# Patient Record
Sex: Male | Born: 1975 | Race: White | Hispanic: No | Marital: Married | Smoking: Former smoker
Health system: Southern US, Community
[De-identification: ages and names within clinical notes are randomized; demographics above are authoritative.]

## PROBLEM LIST (undated history)

## (undated) DIAGNOSIS — R112 Nausea with vomiting, unspecified: Secondary | ICD-10-CM

## (undated) DIAGNOSIS — F419 Anxiety disorder, unspecified: Secondary | ICD-10-CM

## (undated) DIAGNOSIS — K5190 Ulcerative colitis, unspecified, without complications: Secondary | ICD-10-CM

## (undated) DIAGNOSIS — E789 Disorder of lipoprotein metabolism, unspecified: Secondary | ICD-10-CM

## (undated) DIAGNOSIS — Z9889 Other specified postprocedural states: Secondary | ICD-10-CM

## (undated) HISTORY — PX: ENDOVENOUS ABLATION SAPHENOUS VEIN W/ LASER: SUR449

---

## 2009-03-16 ENCOUNTER — Encounter (INDEPENDENT_AMBULATORY_CARE_PROVIDER_SITE_OTHER): Payer: Self-pay | Admitting: *Deleted

## 2009-03-16 ENCOUNTER — Ambulatory Visit (HOSPITAL_COMMUNITY): Admission: RE | Admit: 2009-03-16 | Discharge: 2009-03-16 | Payer: Self-pay | Admitting: *Deleted

## 2010-06-30 ENCOUNTER — Ambulatory Visit: Payer: Self-pay | Admitting: Gastroenterology

## 2010-06-30 DIAGNOSIS — F329 Major depressive disorder, single episode, unspecified: Secondary | ICD-10-CM

## 2010-06-30 DIAGNOSIS — R1032 Left lower quadrant pain: Secondary | ICD-10-CM | POA: Insufficient documentation

## 2010-06-30 DIAGNOSIS — F3289 Other specified depressive episodes: Secondary | ICD-10-CM | POA: Insufficient documentation

## 2010-06-30 DIAGNOSIS — K5190 Ulcerative colitis, unspecified, without complications: Secondary | ICD-10-CM | POA: Insufficient documentation

## 2010-06-30 DIAGNOSIS — K625 Hemorrhage of anus and rectum: Secondary | ICD-10-CM | POA: Insufficient documentation

## 2010-06-30 DIAGNOSIS — K612 Anorectal abscess: Secondary | ICD-10-CM | POA: Insufficient documentation

## 2010-06-30 LAB — CONVERTED CEMR LAB: Tissue Transglutaminase Ab, IgA: 3.5 units (ref <20)

## 2010-07-05 LAB — CONVERTED CEMR LAB
BUN: 14 mg/dL (ref 6–23)
Basophils Absolute: 0 10*3/uL (ref 0.0–0.1)
Bilirubin, Direct: 0.1 mg/dL (ref 0.0–0.3)
CO2: 28 meq/L (ref 19–32)
Calcium: 9.5 mg/dL (ref 8.4–10.5)
Creatinine, Ser: 0.8 mg/dL (ref 0.4–1.5)
Eosinophils Absolute: 0 10*3/uL (ref 0.0–0.7)
Ferritin: 10.3 ng/mL — ABNORMAL LOW (ref 22.0–322.0)
HCT: 42.9 % (ref 39.0–52.0)
Hemoglobin: 14.1 g/dL (ref 13.0–17.0)
Lymphs Abs: 0.8 10*3/uL (ref 0.7–4.0)
MCHC: 33 g/dL (ref 30.0–36.0)
Monocytes Relative: 5.1 % (ref 3.0–12.0)
Neutro Abs: 11.5 10*3/uL — ABNORMAL HIGH (ref 1.4–7.7)
RDW: 16.7 % — ABNORMAL HIGH (ref 11.5–14.6)
TSH: 0.32 microintl units/mL — ABNORMAL LOW (ref 0.35–5.50)
Total Protein: 6.7 g/dL (ref 6.0–8.3)
Transferrin: 287 mg/dL (ref 212.0–360.0)
Vitamin B-12: 441 pg/mL (ref 211–911)

## 2010-07-20 ENCOUNTER — Ambulatory Visit: Payer: Self-pay | Admitting: Gastroenterology

## 2010-07-20 LAB — CONVERTED CEMR LAB
AST: 22 units/L (ref 0–37)
Albumin: 4 g/dL (ref 3.5–5.2)
Alkaline Phosphatase: 45 units/L (ref 39–117)
Basophils Absolute: 0 10*3/uL (ref 0.0–0.1)
Bilirubin, Direct: 0.1 mg/dL (ref 0.0–0.3)
HCT: 42.5 % (ref 39.0–52.0)
Hemoglobin: 14.2 g/dL (ref 13.0–17.0)
Lymphs Abs: 0.5 10*3/uL — ABNORMAL LOW (ref 0.7–4.0)
MCHC: 33.5 g/dL (ref 30.0–36.0)
MCV: 85.4 fL (ref 78.0–100.0)
Monocytes Absolute: 0.1 10*3/uL (ref 0.1–1.0)
Neutro Abs: 12 10*3/uL — ABNORMAL HIGH (ref 1.4–7.7)
Platelets: 319 10*3/uL (ref 150.0–400.0)
RDW: 17.1 % — ABNORMAL HIGH (ref 11.5–14.6)
Total Bilirubin: 0.8 mg/dL (ref 0.3–1.2)

## 2010-08-02 ENCOUNTER — Ambulatory Visit: Payer: Self-pay | Admitting: Gastroenterology

## 2010-08-03 ENCOUNTER — Ambulatory Visit: Payer: Self-pay | Admitting: Gastroenterology

## 2010-08-03 LAB — CONVERTED CEMR LAB
ALT: 33 units/L (ref 0–53)
AST: 18 units/L (ref 0–37)
Basophils Absolute: 0 10*3/uL (ref 0.0–0.1)
Bilirubin, Direct: 0.1 mg/dL (ref 0.0–0.3)
Eosinophils Relative: 0.1 % (ref 0.0–5.0)
Monocytes Absolute: 0.4 10*3/uL (ref 0.1–1.0)
Monocytes Relative: 3.6 % (ref 3.0–12.0)
Neutrophils Relative %: 85.6 % — ABNORMAL HIGH (ref 43.0–77.0)
Platelets: 281 10*3/uL (ref 150.0–400.0)
RDW: 18.5 % — ABNORMAL HIGH (ref 11.5–14.6)
Total Bilirubin: 0.6 mg/dL (ref 0.3–1.2)
WBC: 10.6 10*3/uL — ABNORMAL HIGH (ref 4.5–10.5)

## 2010-09-12 ENCOUNTER — Ambulatory Visit: Payer: Self-pay | Admitting: Gastroenterology

## 2010-09-12 LAB — CONVERTED CEMR LAB
ALT: 32 units/L (ref 0–53)
Albumin: 4.3 g/dL (ref 3.5–5.2)
Basophils Relative: 0.3 % (ref 0.0–3.0)
Bilirubin, Direct: 0.1 mg/dL (ref 0.0–0.3)
Eosinophils Absolute: 0.2 10*3/uL (ref 0.0–0.7)
Eosinophils Relative: 3.4 % (ref 0.0–5.0)
HCT: 47.2 % (ref 39.0–52.0)
Lymphs Abs: 1.5 10*3/uL (ref 0.7–4.0)
MCHC: 33.8 g/dL (ref 30.0–36.0)
MCV: 88.1 fL (ref 78.0–100.0)
Monocytes Absolute: 0.5 10*3/uL (ref 0.1–1.0)
Neutrophils Relative %: 64.6 % (ref 43.0–77.0)
RBC: 5.36 M/uL (ref 4.22–5.81)
Total Protein: 6.9 g/dL (ref 6.0–8.3)
WBC: 6.3 10*3/uL (ref 4.5–10.5)

## 2010-10-12 ENCOUNTER — Ambulatory Visit: Payer: Self-pay | Admitting: Gastroenterology

## 2010-10-26 ENCOUNTER — Telehealth: Payer: Self-pay | Admitting: Gastroenterology

## 2010-10-26 ENCOUNTER — Encounter (INDEPENDENT_AMBULATORY_CARE_PROVIDER_SITE_OTHER): Payer: Self-pay | Admitting: *Deleted

## 2010-12-05 ENCOUNTER — Encounter: Payer: Self-pay | Admitting: Gastroenterology

## 2011-01-23 NOTE — Progress Notes (Signed)
Summary: Labs Needed  ---- Converted from flag ---- ---- 10/24/2010 8:36 AM, Harlow Mares CMA (AAMA) wrote: Left a message on patients machine to call back.   ---- 10/20/2010 11:08 AM, Harlow Mares CMA (AAMA) wrote: Left a message on patients machine to call back.   ---- 10/12/2010 1:26 PM, Harlow Mares CMA (AAMA) wrote: check to see if pt had his labs  ---- 09/12/2010 10:25 AM, Harlow Mares CMA (AAMA) wrote: needs cbc and lfts ------------------------------   mailed letter to patient Harlow Mares CMA (AAMA)  October 26, 2010 3:24 PM

## 2011-01-23 NOTE — Assessment & Plan Note (Signed)
Summary: 2-WEEK F/U APPT...LSW.   History of Present Illness Visit Type: Follow-up Visit Primary GI MD: Sheryn Bison MD FACP FAGA Primary Provider: Michiel Cowboy, PA-C Requesting Provider: n/a Chief Complaint: Patient here today for 2 week f/u ulcerative colitis flare. He states that he is feeling much better and denies any GI symptoms at this time. History of Present Illness:   Reinhart is asymptomatic on tapering dose of prednisone and 6-MP 75 mg a day. He has had serial CBC and liver profiles which have been normal. He is currently having 2 soft nonbloody bowel movements a day without abdominal pain or constitutional symptoms. He denies recurrent infections, fever, chills, nausea vomiting, or general malaise. His are prior medicines listed and reviewed in his chart including daily Zoloft 50 mg, tandem iron replacement, calcium with vitamin D, and folic acid.   GI Review of Systems      Denies abdominal pain, acid reflux, belching, bloating, chest pain, dysphagia with liquids, dysphagia with solids, heartburn, loss of appetite, nausea, vomiting, vomiting blood, weight loss, and  weight gain.        Denies anal fissure, black tarry stools, change in bowel habit, constipation, diarrhea, diverticulosis, fecal incontinence, heme positive stool, hemorrhoids, irritable bowel syndrome, jaundice, light color stool, liver problems, rectal bleeding, and  rectal pain.    Current Medications (verified): 1)  Zoloft 50 Mg Tabs (Sertraline Hcl) .... One Tablet By Mouth Once Daily 2)  Multivitamins   Tabs (Multiple Vitamin) .... One Tablet By Mouth Once Daily 3)  Tylenol 325 Mg Tabs (Acetaminophen) .... As Needed 4)  Lialda 1.2 Gm  Tbec (Mesalamine) .... 2 Tabs Daily 5)  Purinethol 50 Mg Tabs (Mercaptopurine) .Marland Kitchen.. 1 and 1/2 Tabs Daily 6)  Prednisone 20 Mg Tabs (Prednisone) .Marland Kitchen.. 1 and 1/2 Tab Daily (30 Mg) 7)  Folic Acid 1 Mg Tabs (Folic Acid) .Marland Kitchen.. 1 By Mouth Once Daily 8)  Tandem 162-115.2 Mg Caps  (Ferrous Fum-Iron Polysacch) .Marland Kitchen.. 1 Po Once Daily 9)  Caltrate 600+d 600-400 Mg-Unit Tabs (Calcium Carbonate-Vitamin D) .... Two Times A Day  Allergies (verified): 1)  ! Doxycycline  Past History:  Past medical, surgical, family and social histories (including risk factors) reviewed for relevance to current acute and chronic problems.  Past Medical History: Reviewed history from 06/30/2010 and no changes required. Current Problems:  RECTAL BLEEDING (ICD-569.3) ABDOMINAL PAIN, LEFT LOWER QUADRANT (ICD-789.04) DEPRESSION (ICD-311) ULCERATIVE COLITIS (ICD-556.9)  Past Surgical History: Reviewed history from 06/30/2010 and no changes required. Unremarkable  Family History: Reviewed history from 06/30/2010 and no changes required. 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 -stopped 2 years ago Alcohol Use - yes-occasional Daily Caffeine Use: 3 daily  Illicit Drug Use - no  Review of Systems  The patient denies allergy/sinus, anemia, anxiety-new, arthritis/joint pain, back pain, blood in urine, breast changes/lumps, change in vision, confusion, cough, coughing up blood, depression-new, fainting, fatigue, fever, headaches-new, hearing problems, heart murmur, heart rhythm changes, itching, menstrual pain, muscle pains/cramps, night sweats, nosebleeds, pregnancy symptoms, shortness of breath, skin rash, sleeping problems, sore throat, swelling of feet/legs, swollen lymph glands, thirst - excessive , urination - excessive , urination changes/pain, urine leakage, vision changes, and voice change.    Vital Signs:  Patient profile:   35 year old male Height:      73 inches Weight:  238.13 pounds BMI:     31.53 BSA:     2.32 Pulse rate:   80 / minute Pulse rhythm:   regular BP sitting:   118 / 80   (left arm)  Vitals Entered By: Lamona Curl CMA (AAMA) (August 03, 2010 11:00 AM)  Physical Exam  General:  Well developed, well nourished, no acute distress.healthy appearing.  healthy appearing.   Head:  Normocephalic and atraumatic. Eyes:  PERRLA, no icterus.exam deferred to patient's ophthalmologist.  exam deferred to patient's ophthalmologist.   Lungs:  Clear throughout to auscultation. Heart:  Regular rate and rhythm; no murmurs, rubs,  or bruits. Abdomen:  Soft, nontender and nondistended. No masses, hepatosplenomegaly or hernias noted. Normal bowel sounds. Extremities:  No clubbing, cyanosis, edema or deformities noted. Neurologic:  Alert and  oriented x4;  grossly normal neurologically. Psych:  Alert and cooperative. Normal mood and affect.   Impression & Recommendations:  Problem # 1:  ULCERATIVE COLITIS (ICD-556.9) Assessment Improved Continue 6-MP 75 mg a day and Lialda 2.4 g a day with tapering of prednisone at a level of 5 mg per day every 2 weeks. He will see me back in 6 weeks' time and we will check CBC before office visit. If he has a relapse while tapering his prednisone, he is to call for advisement.  Problem # 2:  RECTAL BLEEDING (ICD-569.3) Assessment: Improved  Problem # 3:  DEPRESSION (ICD-311) Assessment: Improved Continue Zoloft 50 mg a day.  Patient Instructions: 1)  Please pick up your medications at your pharmacy.  2)  Prednisone Taper: take 20 mg once daily for 2 weeks, then take 15 mg (3 tabs) once daily for 2 weeks, then take 10mg  (2 tabs) once daily for 2 weeks, then take 5mg  (1 tab) once daily for 2 weeks, then stop. 3)  Please schedule a follow-up appointment in 6 weeks. 4)  Please come to the lab to have a CBC  prior to your appointment. 5)  The medication list was reviewed and reconciled.  All changed / newly prescribed medications were explained.  A complete medication list was provided to the patient / caregiver.  6)  Copy Sent To:Dr.  Baxter Hire [facility] 7)  Please schedule a follow-up appointment in 6 to 8 weeks. Prescriptions: PREDNISONE 5 MG TABS (PREDNISONE) take as directed  #90 x 1   Entered by:   Francee Piccolo CMA (AAMA)   Authorized by:   Mardella Layman MD [facility]   Signed by:   Mardella Layman MD [facility] on 08/03/2010   Method used:   Electronically to        [facility] [phone]* (retail)       [address]       [place], [place]  [address]       Ph: [phone] or [phone]       Fax: [phone]   RxID:   [id number]

## 2011-01-23 NOTE — Assessment & Plan Note (Signed)
Summary: flare up ulcerative colitis...em   History of Present Illness Visit Type: new patient  Primary GI MD: Sheryn Bison MD FACP FAGA Primary Provider: [facility]  Requesting Provider: na Chief Complaint: LLQ abd pain, bloating, chest pain, loss of appetite, rectal pain, change in bowel habits, diarrhea, and rectal bleeding  History of Present Illness:   35 year old Caucasian male with 4 year history of recurrent cramping, diarrhea, and rectal bleeding with previous diagnosis of inflammatory bowel disease by Dr. Sabino Gasser several years ago with colonoscopy report available dated March 16, 2009 which showed evidence of a perirectal fistula, diffuse colitis with an incomplete colonoscopy, followed by a CT scan showing diffuse colitis from the rectum to the hepatic flexure area the colon without any evidence of perirectal abscess or fistula formation.  Apparently Brian Blackburn has been on rapidly tapering doses of prednisone for 5 times over the last several years. He allegedly will go from 50 mg a day of prednisone to 10 mg a day and over a two-week period as directed allegedly. He took Asacol for 1.5 years without symptomatic improvement. He has not been on previous immunosuppressant therapy or biological therapy.  He currently is having 6-8 bowel movements a day with rectal bleeding and some mild abdominal cramping and mild malaise and fatigue. He's had no fever chills, skin rashes, or joint pains. He has had drainage of a perirectal fistula x2. He denies use or abuse of alcohol, cigarettes, or NSAIDs. He smoked heavily for many years but discontinued 4 years ago about the time his colitis flare. He does have a long history of a mood disorder and is on Zoloft 50 mg a day. Family history noncontributory.  He currently denies rectal pain or rectal drainage. He is currently tapered down to 10 mg of prednisone a day, complains of fatigue and weakness but no systemic complaints. He is  eating a regular diet and denies any specific food intolerances. He's had no problems with arthritis, skin rash, or joint pains. Family history is entirely negative.   GI Review of Systems    Reports abdominal pain, bloating, and  loss of appetite.     Location of  Abdominal pain: LLQ.    Denies acid reflux, belching, chest pain, dysphagia with liquids, dysphagia with solids, heartburn, nausea, vomiting, vomiting blood, weight loss, and  weight gain.      Reports change in bowel habits, diarrhea, rectal bleeding, and  rectal pain.     Denies anal fissure, black tarry stools, constipation, diverticulosis, fecal incontinence, heme positive stool, hemorrhoids, irritable bowel syndrome, jaundice, light color stool, and  liver problems.    Current Medications (verified): 1)  Zoloft 50 Mg Tabs (Sertraline Hcl) .... One Tablet By Mouth Once Daily 2)  Multivitamins   Tabs (Multiple Vitamin) .... One Tablet By Mouth Once Daily 3)  Tylenol 325 Mg Tabs (Acetaminophen) .... As Needed  Allergies (verified): No Known Drug Allergies  Past History:  Past medical, surgical, family and social histories (including risk factors) reviewed for relevance to current acute and chronic problems.  Past Medical History: Current Problems:  RECTAL BLEEDING (ICD-569.3) ABDOMINAL PAIN, LEFT LOWER QUADRANT (ICD-789.04) DEPRESSION (ICD-311) ULCERATIVE COLITIS (ICD-556.9)  Past Surgical History: Unremarkable  Family History: Reviewed history and no changes required. 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  Alcohol  Use - no Daily Caffeine Use: 3 daily  Illicit Drug Use - no Smoking Status:  quit Drug Use:  no  Review of Systems       The patient complains of fatigue.  The patient denies allergy/sinus, anemia, anxiety-new,  arthritis/joint pain, back pain, blood in urine, breast changes/lumps, change in vision, confusion, cough, coughing up blood, depression-new, fainting, fever, headaches-new, hearing problems, heart murmur, heart rhythm changes, itching, muscle pains/cramps, night sweats, nosebleeds, shortness of breath, skin rash, sleeping problems, sore throat, swelling of feet/legs, swollen lymph glands, thirst - excessive, urination - excessive, urination changes/pain, urine leakage, vision changes, and voice change.   General:  Complains of malaise; denies fever, chills, sweats, anorexia, fatigue, weakness, weight loss, and sleep disorder. Eyes:  Denies blurring, diplopia, irritation, discharge, vision loss, scotoma, eye pain, and photophobia. ENT:  Denies earache, ear discharge, tinnitus, decreased hearing, nasal congestion, loss of smell, nosebleeds, sore throat, hoarseness, and difficulty swallowing. CV:  Denies chest pains, angina, palpitations, syncope, dyspnea on exertion, orthopnea, PND, peripheral edema, and claudication. Resp:  Denies dyspnea at rest, dyspnea with exercise, cough, sputum, wheezing, coughing up blood, and pleurisy. GI:  Complains of abdominal pain, gas/bloating, diarrhea, and bloody BM's; denies difficulty swallowing, pain on swallowing, nausea, indigestion/heartburn, vomiting, vomiting blood, jaundice, constipation, change in bowel habits, black BMs, and fecal incontinence. GU:  Denies urinary burning, blood in urine, urinary frequency, urinary hesitancy, nocturnal urination, urinary incontinence, penile discharge, genital sores, decreased libido, and erectile dysfunction. MS:  Denies joint pain / LOM, joint swelling, joint stiffness, joint deformity, low back pain, muscle weakness, muscle cramps, muscle atrophy, leg pain at night, leg pain with exertion, and shoulder pain / LOM hand / wrist pain (CTS). Derm:  Denies rash, itching, dry skin, hives, moles, warts, and unhealing ulcers. Neuro:   Denies weakness, paralysis, abnormal sensation, seizures, syncope, tremors, vertigo, transient blindness, frequent falls, frequent headaches, difficulty walking, headache, sciatica, radiculopathy other:, restless legs, memory loss, and confusion. Psych:  Complains of depression; denies anxiety, memory loss, suicidal ideation, hallucinations, paranoia, phobia, and confusion; history of a" Mood Disorder" without current problems.. Heme:  Denies bruising, bleeding, enlarged lymph nodes, and pagophagia.  Vital Signs:  Patient profile:   35 year old male Height:      73 inches Weight:      229 pounds BMI:     30.32 Pulse rate:   88 / minute Pulse rhythm:   regular BP sitting:   128 / 76  (left arm) Cuff size:   regular  Vitals Entered By: Ok Anis CMA (June 30, 2010 8:24 AM)  Physical Exam  General:  Well developed, well nourished, no acute distress.healthy appearing.   Head:  Normocephalic and atraumatic. Eyes:  PERRLA, no icterus.exam deferred to patient's ophthalmologist.   Mouth:  No deformity or lesions, dentition normal. Neck:  Supple; no masses or thyromegaly. Lungs:  Clear throughout to auscultation. Heart:  Regular rate and rhythm; no murmurs, rubs,  or bruits. Abdomen:  Soft, nontender and nondistended. No masses, hepatosplenomegaly or hernias noted. Normal bowel sounds. Rectal:  inspection rectum is unremarkable. There is no evidence of perirectal fissures or fistulae. Rectal exam shows some tenderness but no masses. There is bloody mucus present in the rectal vault. Prostate:  .normal size prostate.   Msk:  Symmetrical with no gross deformities. Normal posture. Pulses:  Normal pulses noted. Extremities:  No clubbing, cyanosis, edema or deformities noted. Neurologic:  Alert and  oriented x4;  grossly normal neurologically. Cervical  Nodes:  No significant cervical adenopathy. Psych:  Alert and cooperative. Normal mood and affect.   Impression & Recommendations:  Problem  # 1:  ULCERATIVE COLITIS (ICD-556.9) Assessment Deteriorated Probable severe ulcer colitis versus Crohn's colitis. There is no evidence of perirectal fistulae or abscesses at this time. I do not agree with this previous therapy of high-dose prednisone with quick tapering on multiple occasions. I've increased his prednisone currently because of his acute illness back to 40 mg a day, have placed him only low at 2.4 g a day, and we'll start 6-MP 75 mg a day with appropriate monitors a patient. We've begun discussions for possible Remicade infusions. Labs have been ordered including 6-MP genetics and IBD serologies. I'll see him back in 2 weeks' time and decide about future flex sigmoid-colonoscopy exams. He is to follow a low fiber diet as tolerated. Orders: TLB-CBC Platelet - w/Differential (85025-CBCD) TLB-BMP (Basic Metabolic Panel-BMET) (80048-METABOL) TLB-Hepatic/Liver Function Pnl (80076-HEPATIC) TLB-TSH (Thyroid Stimulating Hormone) (84443-TSH) TLB-B12, Serum-Total ONLY (96295-M84) TLB-Ferritin (82728-FER) TLB-Folic Acid (Folate) (82746-FOL) TLB-IBC Pnl (Iron/FE;Transferrin) (83550-IBC) TLB-Sedimentation Rate (ESR) (85652-ESR) TLB-IgA (Immunoglobulin A) (82784-IGA) T-Sprue Panel (Celiac Disease Aby Eval) (83516x3/[address])  Problem # 2:  ABDOMINAL PAIN, LEFT LOWER QUADRANT (ICD-789.04) Assessment: Improved  Problem # 3:  DEPRESSION (ICD-311) Assessment: Improved Continue Zoloft 50 mg a day  Problem # 4:  ABSCESS, RECTUM (ICD-566) Assessment: Improved Not a current problem. The presence of perirectal disease certainly suggest underlying Crohn's disease.  Patient Instructions: 1)  Begin new medication as directed.  See medication list. 2)  Please go to the basement for lab work. 3)  Increase prednisone to 40 mg a day and start Lialba 2.4 g a day with p.r.n. Imodium use. 4)  Please schedule a follow-up appointment in 2 weeks.  5)  The medication list was reviewed and reconciled.   All changed / newly prescribed medications were explained.  A complete medication list was provided to the patient / caregiver. 6)  Copy sent to : [facility] family practice at [facility] 7)  Advised to stick with a low residue diet  avoiding food that can irritate bowel (see handout).  8)  Inflammatory Bowel Disease brochure given.  Prescriptions: FOLIC ACID 1 MG TABS (FOLIC ACID) 1 by mouth once daily  #30 x 6   Entered by:   Ashok Cordia RN   Authorized by:   Mardella Layman MD [facility]   Signed by:   Ashok Cordia RN on 06/30/2010   Method used:   [facility] to        [facility] [phone][facility] (retail)       [address]       [place], [place]  [address]       Ph: [phone] or [phone]       Fax: [phone]   RxID:   [id number] PREDNISONE 20 MG TABS (PREDNISONE) 2 tabs daily  #100 x 6   Entered by:   Ashok Cordia RN   Authorized by:   Mardella Layman MD [facility]   Signed by:   Ashok Cordia RN on 06/30/2010   Method used:   Electronically to        [facility] [phone]* (retail)       [address]       [place], [place]  [address]       Ph: [phone] or [phone]       Fax: [phone]   RxID:   [id number] PURINETHOL 50 MG TABS (MERCAPTOPURINE) 1  and 1/2 tabs daily  #45 x 6   Entered by:   Ashok Cordia RN   Authorized by:   Mardella Layman MD [facility]   Signed by:   Ashok Cordia RN on 06/30/2010   Method used:   Electronically to        [facility] [phone]* (retail)       [address]       [place], [place]  [address]       Ph: [phone] or [phone]       Fax: [phone]   RxID:   [id number] LIALDA 1.2 GM  TBEC (MESALAMINE) 2 tabs daily  #60 x 6   Entered by:   Ashok Cordia RN   Authorized by:   Mardella Layman MD [facility]   Signed by:   Ashok Cordia RN on 06/30/2010   Method used:   Electronically to        [facility] [phone]* (retail)       [address]       [place], [place]  [address]       Ph: [phone] or [phone]       Fax: [phone]    RxID:   [id number]

## 2011-01-23 NOTE — Procedures (Signed)
Summary: Colon and path   Colonoscopy  Procedure date:  03/16/2009  Findings:      Location:  [facility].   NAME:  Brian Blackburn, Brian Blackburn               ACCOUNT NO.:  [id number]      MEDICAL RECORD NO.:  [id number]          PATIENT TYPE:  AMB      LOCATION:  ENDO                         FACILITY:  [facility]      PHYSICIAN:  Georgiana Spinner, M.D.    DATE OF BIRTH:  1975-12-25      DATE OF PROCEDURE:   DATE OF DISCHARGE:                                  OPERATIVE REPORT      PROCEDURE:  Colonoscopy with biopsy.      INDICATIONS:  Crohn colitis.      ANESTHESIA:  Fentanyl 100 mcg, Versed 10 mg.      PROCEDURE:  With the patient mildly sedated in the left lateral   decubitus position, a rectal examination was performed.  There was a   pinpoint opening near the anal orifice on his left buttock that   represented the end of a fistula, but subsequently the rest of the   rectal examination was unremarkable.  Then the Pentax videoscopic   pediatric colonoscope was inserted in the rectum and passed under direct   vision to a very narrowed area in the colon, at which point I elected to   not proceed any further.  The opening was too narrow to safely pass the   scope.  Photographs and biopsies were taken as we withdrew the   colonoscope, taking circumferential views of the remaining colonic   mucosa, all of which was markedly inflamed.  The endoscope was placed in   retroflexion in the rectum to view the anal canal.  The endoscope was   then straightened and withdrawn.  The patient's vital signs, pulse   oximeter remained stable.  The patient tolerated the procedure well   without apparent complications.      FINDINGS:  Significant changes of the colon with a single fistula noted.      IMPRESSION:  Severe colitis, probably Crohn's.      PLAN:  Will get a CT of the abdomen and pelvis to rule out any   infectious process that could be going on in the area of the fistula   mostly because I  think this patient would benefit subsequently with   biologic therapy in the form of Remicade or one of the other newer   agents, and I will have the patient follow up with me in the next week   after we have gotten a CT scan to proceed with that plan.                  ______________________________   Georgiana Spinner, M.D.            GMO/MEDQ  D:  03/16/2009  T:  03/16/2009  Job:  [id number]      cc:   [facility]     SP-Surgical Pathology - STATUS: Final  .  Perform Date: 24Mar10 00:01  Ordered By: Jarvis Newcomer,            Ordered Date: [phone] 10:08  Facility: [facility]                              Department: CPATH  Service Report Text  [facility]   [address] [place], [place] [address]   [phone]    REPORT OF SURGICAL PATHOLOGY    Case #: [id number]   Patient Name: Brian Blackburn   Office Chart Number: N/A    MRN: [id number]   Pathologist: H. Hollice Espy, MD   DOB/Age 23-Oct-1976 (Age: 41) Gender: M   Date Taken: 03/16/2009   Date Received: 03/16/2009    FINAL DIAGNOSIS    ***MICROSCOPIC EXAMINATION AND DIAGNOSIS***    COLON, BIOPSY: CHRONIC ACTIVE COLITIS, NO EVIDENCE OF GRANULOMA   OR DYSPLASIA SEEN. PLEASE SEE COMMENT.    COMMENT   All biopsy fragments show chronic active colitis characterized by   crypt architecture distortion, expansion of the lamina propria by   chronic inflammatory infiltrates and lymphoid aggregates,   cryptitis and crypt abscess. No granuloma is seen. The overall   findings are most consistent with inflammatory bowel disease and   most compatible with ulcerative colitis. No dysplasia or   malignancy is seen. Clinical and endoscopic correlation is   highly recommended. (HCL:gt, 03/17/09)    gdt   Date Reported: 03/17/2009 H. Hollice Espy, MD   *** Electronically Signed Out By [facility] ***    Clinical information   Colitis; ? Crohn' s (tc)     specimen(s) obtained   Colon, biopsy    Gross Description   Received in formalin are tan, soft tissue fragments that are   submitted in toto. Number: 4   Size: 0.2 to 0.3 cm. (GP:jy) 03/16/09    jy/   Additional Information  [id number] RESULT STATUS : F  External IF Update Timestamp : 2009-03-16:10:08:00.000000

## 2011-01-23 NOTE — Assessment & Plan Note (Signed)
Summary: 2-WEEK F/U APPT...LSW.   History of Present Illness Visit Type: Follow-up Visit Primary GI MD: Sheryn Bison MD Faith Rogue Primary Provider: [facility] at [facility]  Requesting Provider: na Chief Complaint: ulcerative colitis flare, symptoms have improved History of Present Illness:   33% improvement with decreased stooling, decrease bleeding, and no abdominal cramping. He has had no side effects of prednisone except for some myalgias. Appetite is good and his weight is stable and he denies upper gastrointestinal, hepatobiliary, or cardiopulmonary complaints. He's had no change in his mental status or psychiatric difficulties. Lab data was unremarkable except for iron deficiency he currently is on tandem and folic acid.   GI Review of Systems    Reports bloating.      Denies abdominal pain, acid reflux, belching, chest pain, dysphagia with liquids, dysphagia with solids, heartburn, loss of appetite, nausea, vomiting, vomiting blood, weight loss, and  weight gain.      Reports diarrhea and  rectal bleeding.     Denies anal fissure, black tarry stools, change in bowel habit, constipation, diverticulosis, fecal incontinence, heme positive stool, hemorrhoids, irritable bowel syndrome, jaundice, light color stool, liver problems, and  rectal pain.    Current Medications (verified): 1)  Zoloft 50 Mg Tabs (Sertraline Hcl) .... One Tablet By Mouth Once Daily 2)  Multivitamins   Tabs (Multiple Vitamin) .... One Tablet By Mouth Once Daily 3)  Tylenol 325 Mg Tabs (Acetaminophen) .... As Needed 4)  Lialda 1.2 Gm  Tbec (Mesalamine) .... 2 Tabs Daily 5)  Purinethol 50 Mg Tabs (Mercaptopurine) .Marland Kitchen.. 1 and 1/2 Tabs Daily 6)  Prednisone 20 Mg Tabs (Prednisone) .... 2 Tabs Daily 7)  Folic Acid 1 Mg Tabs (Folic Acid) .Marland Kitchen.. 1 By Mouth Once Daily 8)  Tandem 162-115.2 Mg Caps (Ferrous Fum-Iron Polysacch) .Marland Kitchen.. 1 Po Once Daily  Allergies (verified): 1)  ! Doxycycline  Past  History:  Past medical, surgical, family and social histories (including risk factors) reviewed for relevance to current acute and chronic problems.  Past Medical History: Reviewed history from 06/30/2010 and no changes required. Current Problems:  RECTAL BLEEDING (ICD-569.3) ABDOMINAL PAIN, LEFT LOWER QUADRANT (ICD-789.04) DEPRESSION (ICD-311) ULCERATIVE COLITIS (ICD-556.9)  Past Surgical History: Reviewed history from 06/30/2010 and no changes required. Unremarkable  Family History: Reviewed history from 06/30/2010 and no changes required. 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  Alcohol Use - no Daily Caffeine Use: 3 daily  Illicit Drug Use - no  Review of Systems       The patient complains of allergy/sinus, anemia, anxiety-new, arthritis/joint pain, back pain, fatigue, and itching.  The patient denies blood in urine, breast changes/lumps, change in vision, confusion, cough, coughing up blood, depression-new, fainting, fever, headaches-new, hearing problems, heart murmur, heart rhythm changes, menstrual pain, muscle pains/cramps, night sweats, nosebleeds, pregnancy symptoms, shortness of breath, skin rash, sleeping problems, sore throat, swelling of feet/legs, swollen lymph glands, thirst - excessive, urination - excessive, urination changes/pain, urine leakage, vision changes, and voice change.    Vital Signs:  Patient profile:   35 year old male Height:      73 inches Weight:      231 pounds BMI:     30.59 Pulse rate:   68 / minute Pulse rhythm:   regular BP sitting:   110 / 70  (  left arm) Cuff size:   regular  Vitals Entered By: June McMurray CMA Duncan Dull) (July 20, 2010 10:45 AM)  Physical Exam  General:  Well developed, well nourished, no acute distress.Some moon facies noted  but no stigmata of chronic liver disease or icterus Head:  Normocephalic and atraumatic. Eyes:  PERRLA, no icterus.exam deferred to patient's ophthalmologist.  exam deferred to patient's ophthalmologist.   Lungs:  Clear throughout to auscultation. Heart:  Regular rate and rhythm; no murmurs, rubs,  or bruits. Abdomen:  Soft, nontender and nondistended. No masses, hepatosplenomegaly or hernias noted. Normal bowel sounds. Extremities:  No clubbing, cyanosis, edema or deformities noted. Neurologic:  Alert and  oriented x4;  grossly normal neurologically. Psych:  Alert and cooperative. Normal mood and affect.   Impression & Recommendations:  Problem # 1:  ULCERATIVE COLITIS (ICD-556.9) Assessment Improved Continue her medications but will decrease prednisone to 30 mg a day for 2 weeks with office followup at that time. We have started 6-MP 75 mg a day, now repeat his CBC and liver profile today and in 2 weeks. I've advised him to take Caltrate 600 with vitamin D 2 a day. He will probably need flexible sigmoidoscopy to assess mucosal healing in the next few months.  Problem # 2:  ABDOMINAL PAIN, LEFT LOWER QUADRANT (ICD-789.04) Assessment: Improved  Patient Instructions: 1)  Please go to the basement for lab work. 2)  Decrease Prednisone to 30 mg (1 and 1/2 tabs) daily. 3)  Begin Caltrate 600 mg + Vit D two times a day . 4)  Please schedule a follow-up appointment in 2 weeks. Please stop by the lab the day before your appt for lab work. 5)  The medication list was reviewed and reconciled.  All changed / newly prescribed medications were explained.  A complete medication list was provided to the patient / caregiver. 6)  Copy sent to : [facility] at [facility].  Appended Document: 2-WEEK F/U APPT...LSW.    Clinical Lists Changes  Orders: Added new Test order of TLB-Hepatic/Liver Function Pnl (80076-HEPATIC) - Signed Added new Test order of TLB-CBC Platelet - w/Differential  (85025-CBCD) - Signed      Appended Document: 2-WEEK F/U APPT...LSW.    Clinical Lists Changes  Medications: Changed medication from PREDNISONE 20 MG TABS (PREDNISONE) 2 tabs daily to PREDNISONE 20 MG TABS (PREDNISONE) 1 and 1/2 tab daily (30 mg) Added new medication of CALTRATE 600+D 600-400 MG-UNIT TABS (CALCIUM CARBONATE-VITAMIN D) two times a day

## 2011-01-23 NOTE — Assessment & Plan Note (Signed)
Summary: 6-WEEK F/U APPT...LSW.   History of Present Illness Visit Type: Follow-up Visit Primary GI MD: Sheryn Bison MD FACP FAGA Primary Provider: Michiel Cowboy, PA-C Requesting Provider: n/a Chief Complaint: F/u for ulcerative colitis. Pt denies any GI complaints  History of Present Illness:   He has tapered off his prednisone, has not used prednisone in 10 days. He continues on his other medications including 6-MP 75 mg a day and Lialba 2.4 g a day. He is having normal bowel movements without rectal bleeding or abdominal pain or any systemic complaints.   GI Review of Systems      Denies abdominal pain, acid reflux, belching, bloating, chest pain, dysphagia with liquids, dysphagia with solids, heartburn, loss of appetite, nausea, vomiting, vomiting blood, weight loss, and  weight gain.        Denies anal fissure, black tarry stools, change in bowel habit, constipation, diarrhea, diverticulosis, fecal incontinence, heme positive stool, hemorrhoids, irritable bowel syndrome, jaundice, light color stool, liver problems, rectal bleeding, and  rectal pain.    Current Medications (verified): 1)  Zoloft 50 Mg Tabs (Sertraline Hcl) .... One Tablet By Mouth Once Daily 2)  Multivitamins   Tabs (Multiple Vitamin) .... One Tablet By Mouth Once Daily 3)  Tylenol 325 Mg Tabs (Acetaminophen) .... As Needed 4)  Lialda 1.2 Gm  Tbec (Mesalamine) .... 2 Tabs Daily 5)  Purinethol 50 Mg Tabs (Mercaptopurine) .Marland Kitchen.. 1 and 1/2 Tabs Daily 6)  Folic Acid 1 Mg Tabs (Folic Acid) .Marland Kitchen.. 1 By Mouth Once Daily 7)  Tandem 162-115.2 Mg Caps (Ferrous Fum-Iron Polysacch) .Marland Kitchen.. 1 Po Once Daily 8)  Caltrate 600+d 600-400 Mg-Unit Tabs (Calcium Carbonate-Vitamin D) .... Two Times A Day  Allergies (verified): 1)  ! Doxycycline  Past History:  Past medical, surgical, family and social histories (including risk factors) reviewed for relevance to current acute and chronic problems.  Past Medical History: Reviewed  history from 06/30/2010 and no changes required. Current Problems:  RECTAL BLEEDING (ICD-569.3) ABDOMINAL PAIN, LEFT LOWER QUADRANT (ICD-789.04) DEPRESSION (ICD-311) ULCERATIVE COLITIS (ICD-556.9)  Past Surgical History: Reviewed history from 06/30/2010 and no changes required. Unremarkable  Family History: Reviewed history from 06/30/2010 and no changes required. 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 -stopped 2 years ago Alcohol Use - yes-occasional Daily Caffeine Use: 3 daily  Illicit Drug Use - no  Review of Systems  The patient denies allergy/sinus, anemia, anxiety-new, arthritis/joint pain, back pain, blood in urine, breast changes/lumps, change in vision, confusion, cough, coughing up blood, depression-new, fainting, fatigue, fever, headaches-new, hearing problems, heart murmur, heart rhythm changes, itching, menstrual pain, muscle pains/cramps, night sweats, nosebleeds, pregnancy symptoms, shortness of breath, skin rash, sleeping problems, sore throat, swelling of feet/legs, swollen lymph glands, thirst - excessive , urination - excessive , urination changes/pain, urine leakage, vision changes, and voice change.    Vital Signs:  Patient profile:   35 year old male Height:      73 inches Weight:      235 pounds BMI:     31.12 BSA:     2.31 Pulse rate:   72 / minute Pulse rhythm:   regular BP sitting:   120 / 76  (left arm) Cuff size:   regular  Vitals Entered By: Ok Anis CMA (September 12, 2010 9:54 AM)  Physical Exam  General:  Well developed, well nourished, no acute distress.healthy appearing.  healthy appearing.   Head:  Normocephalic and atraumatic. Eyes:  PERRLA, no icterus.exam deferred to patient's ophthalmologist.  exam deferred to patient's ophthalmologist.     Abdomen:  Soft, nontender and nondistended. No masses, hepatosplenomegaly or hernias noted. Normal bowel sounds. Psych:  Alert and cooperative. Normal mood and affect.   Impression & Recommendations:  Problem # 1:  ULCERATIVE COLITIS (ICD-556.9) Assessment Improved Continue 6-MP 75 mg a day and Lialba 2.4 g a day with CBC and liver profile today, in one month, and in 3 months for his clinic visit. He is to call should he have a relapse of his problems. I see no need for colonoscopy at this time.  Problem # 2:  RECTAL BLEEDING (ICD-569.3) Assessment: Improved  Problem # 3:  DEPRESSION (ICD-311) Assessment: Improved Continued Zoloft 50 mg a day as tolerated.  Other Orders: TLB-CBC Platelet - w/Differential (85025-CBCD) TLB-Hepatic/Liver Function Pnl (80076-HEPATIC)  Patient Instructions: 1)  Copy sent to : Michiel Cowboy, PA-C 2)  Please continue current medications.  3)  Please go to the basement today for your labs.  4)  Inflammatory Bowel Disease brochure given.  5)  You will need follow up labs in one month, two months and three months I will call you to remind you. 6)  Please schedule a follow-up appointment in 3 months. 7)  The medication list was reviewed and reconciled.  All changed / newly prescribed medications were explained.  A complete medication list was provided to the patient / caregiver.

## 2011-01-23 NOTE — Letter (Signed)
Summary: Appointment Reminder  [facility]  [address] [place], [place] [address]   Phone: [phone]  Fax: [phone]        October 26, 2010 MRN: [id number]    Brian Blackburn [address] [place], [place]  [address]    Dear Brian Blackburn,   We have been unable to reach you by phone to schedule your follow up   labs that were recommended for you by Dr. Jarold Motto. It is very   important that we reach you to schedule this appointment. We hope that   you allow [place] to participate in your health care needs. Please contact us   at [phone] at your earliest convenience to schedule your   appointment.     Sincerely,    Harlow Mares CMA (AAMA)

## 2011-01-25 NOTE — Letter (Signed)
Summary: Office Visit Letter  [facility]  [address] [place], [place] [address]   Phone: [phone]  Fax: [phone]      December 05, 2010 MRN: [id number]   JAVIN NONG [address] [place], [place]  [address]   Dear Mr. [place],   According to our records, it is time for you to schedule a follow-up office visit with [place].   At your convenience, please call [phone] (option #2)to schedule an office visit. If you have any questions, concerns, or feel that this letter is in error, we would appreciate your call.   Sincerely,    Vania Rea. Jarold Motto, M.D.  [facility] [phone]

## 2011-04-05 LAB — BASIC METABOLIC PANEL
Calcium: 8.7 mg/dL (ref 8.4–10.5)
GFR calc Af Amer: >60 mL/min (ref >60)
GFR calc non Af Amer: >60 mL/min (ref >60)
Potassium: 3.3 mEq/L — ABNORMAL LOW (ref 3.5–5.1)
Sodium: 140 mEq/L (ref 135–145)

## 2011-05-08 NOTE — Op Note (Signed)
NAME:  Brian Blackburn, Brian Blackburn NO.:  [id number]   MEDICAL RECORD NO.:  [id number]          PATIENT TYPE:  AMB   LOCATION:  ENDO                         FACILITY:  [facility]   PHYSICIAN:  Georgiana Spinner, M.D.    DATE OF BIRTH:  October 02, 1976   DATE OF PROCEDURE:  DATE OF DISCHARGE:                               OPERATIVE REPORT   PROCEDURE:  Colonoscopy with biopsy.   INDICATIONS:  Crohn colitis.   ANESTHESIA:  Fentanyl 100 mcg, Versed 10 mg.   PROCEDURE:  With the patient mildly sedated in the left lateral  decubitus position, a rectal examination was performed.  There was a  pinpoint opening near the anal orifice on his left buttock that  represented the end of a fistula, but subsequently the rest of the  rectal examination was unremarkable.  Then the Pentax videoscopic  pediatric colonoscope was inserted in the rectum and passed under direct  vision to a very narrowed area in the colon, at which point I elected to  not proceed any further.  The opening was too narrow to safely pass the  scope.  Photographs and biopsies were taken as we withdrew the  colonoscope, taking circumferential views of the remaining colonic  mucosa, all of which was markedly inflamed.  The endoscope was placed in  retroflexion in the rectum to view the anal canal.  The endoscope was  then straightened and withdrawn.  The patient's vital signs, pulse  oximeter remained stable.  The patient tolerated the procedure well  without apparent complications.   FINDINGS:  Significant changes of the colon with a single fistula noted.   IMPRESSION:  Severe colitis, probably Crohn's.   PLAN:  Will get a CT of the abdomen and pelvis to rule out any  infectious process that could be going on in the area of the fistula  mostly because I think this patient would benefit subsequently with  biologic therapy in the form of Remicade or one of the other newer  agents, and I will have the patient follow up with me in  the next week  after we have gotten a CT scan to proceed with that plan.           ______________________________  Georgiana Spinner, M.D.     GMO/MEDQ  D:  03/16/2009  T:  03/16/2009  Job:  [id number]   cc:   [facility]

## 2012-09-17 ENCOUNTER — Encounter: Payer: Self-pay | Admitting: Gastroenterology

## 2018-06-30 ENCOUNTER — Other Ambulatory Visit: Payer: Self-pay

## 2018-06-30 ENCOUNTER — Encounter (HOSPITAL_COMMUNITY): Payer: Self-pay

## 2018-06-30 ENCOUNTER — Emergency Department (HOSPITAL_COMMUNITY): Payer: Commercial Managed Care - PPO

## 2018-06-30 ENCOUNTER — Emergency Department (HOSPITAL_COMMUNITY)
Admission: EM | Admit: 2018-06-30 | Discharge: 2018-06-30 | Disposition: A | Discharge status: Home / Self Care | Payer: Commercial Managed Care - PPO | Attending: Emergency Medicine | Admitting: Emergency Medicine

## 2018-06-30 DIAGNOSIS — R079 Chest pain, unspecified: Secondary | ICD-10-CM

## 2018-06-30 DIAGNOSIS — F17220 Nicotine dependence, chewing tobacco, uncomplicated: Secondary | ICD-10-CM | POA: Insufficient documentation

## 2018-06-30 DIAGNOSIS — R002 Palpitations: Secondary | ICD-10-CM | POA: Diagnosis not present

## 2018-06-30 HISTORY — DX: Anxiety disorder, unspecified: F41.9

## 2018-06-30 HISTORY — DX: Ulcerative colitis, unspecified, without complications: K51.90

## 2018-06-30 LAB — CBC
HCT: 46.8 % (ref 39.0–52.0)
Hemoglobin: 16.3 g/dL (ref 13.0–17.0)
MCH: 31.6 pg (ref 26.0–34.0)
MCHC: 34.8 g/dL (ref 30.0–36.0)
MCV: 90.7 fL (ref 78.0–100.0)
Platelets: 198 10*3/uL (ref 150–400)
RBC: 5.16 MIL/uL (ref 4.22–5.81)
RDW: 13.5 % (ref 11.5–15.5)
WBC: 7.3 10*3/uL (ref 4.0–10.5)

## 2018-06-30 LAB — BASIC METABOLIC PANEL
Anion gap: 17 — ABNORMAL HIGH (ref 5–15)
BUN: 16 mg/dL (ref 6–20)
CO2: 22 mmol/L (ref 22–32)
Calcium: 10 mg/dL (ref 8.9–10.3)
Chloride: 100 mmol/L (ref 98–111)
Creatinine, Ser: 0.9 mg/dL (ref 0.61–1.24)
GFR calc Af Amer: >60 mL/min (ref >60)
GFR calc non Af Amer: >60 mL/min (ref >60)
Glucose, Bld: 89 mg/dL (ref 70–99)
Potassium: 3.7 mmol/L (ref 3.5–5.1)
Sodium: 139 mmol/L (ref 135–145)

## 2018-06-30 LAB — I-STAT TROPONIN, ED
TROPONIN I, POC: 0.01 ng/mL (ref 0.00–0.08)
Troponin i, poc: 0 ng/mL (ref 0.00–0.08)

## 2018-06-30 LAB — D-DIMER, QUANTITATIVE (NOT AT ARMC): D-Dimer, Quant: 0.74 ug/mL-FEU — ABNORMAL HIGH (ref 0.00–0.50)

## 2018-06-30 MED ORDER — IOPAMIDOL (ISOVUE-370) INJECTION 76%
100.0000 mL | Freq: Once | INTRAVENOUS | Status: AC | PRN
  Administered 2018-06-30: 100 mL via INTRAVENOUS

## 2018-06-30 MED ORDER — IOPAMIDOL (ISOVUE-370) INJECTION 76%
INTRAVENOUS | Status: AC
  Filled 2018-06-30: qty 100

## 2018-06-30 NOTE — ED Notes (Signed)
Patient transported to CT 

## 2018-06-30 NOTE — ED Provider Notes (Signed)
[facility]-EMERGENCY DEPT Provider Note   CSN: [id number] Arrival date & time: 06/30/18  4098     History   Chief Complaint Chief Complaint  Patient presents with  . Chest Pain  . Palpitations    HPI Brian Blackburn is a 42 y.o. male.  HPI Brian Blackburn is a 42 y.o. male with history of anxiety and ulcerative colitis, presents to emergency department complaining of palpitations and chest pain.  Patient states his palpitations started 2 days ago.  He states he feels like his heart is beating hard and fast.  He states that comes and goes and currently he does not feel anything.  States this morning he started having intermittent pain in the left lower chest.  He states pain comes and goes, independent of exertion.  He states it does seem to be worse when he is moving.  He denies pleuritic components.  No cough.  No fever or chills.  No injuries.  He states that pain lasts several minutes at a time.  He denies any recent travel or surgeries.  He denies any new medications.  Denies any drugs but does drink daily alcohol.  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  Denies any pain or swelling in his legs at this time.  No treatment prior to coming in.  No prior cardiac or lung issues  Past Medical History:  Diagnosis Date  . Anxiety   . Ulcerative colitis [facility])     Patient Active Problem List   Diagnosis Date Noted  . DEPRESSION 06/30/2010  . ULCERATIVE COLITIS 06/30/2010  . ABSCESS, RECTUM 06/30/2010  . RECTAL BLEEDING 06/30/2010  . ABDOMINAL PAIN, LEFT LOWER QUADRANT 06/30/2010    Past Surgical History:  Procedure Laterality Date  . ENDOVENOUS ABLATION SAPHENOUS VEIN W/ LASER          Home Medications    Prior to Admission medications   Not on File    Family History Family History  Problem Relation Age of Onset  . Heart failure Father     Social History Social History   Tobacco Use  .  Smoking status: Never Smoker  . Smokeless tobacco: Current User    Types: Chew  Substance Use Topics  . Alcohol use: Yes  . Drug use: Never     Allergies   Doxycycline   Review of Systems Review of Systems  Constitutional: Negative for chills and fever.  Respiratory: Positive for chest tightness and shortness of breath. Negative for cough.   Cardiovascular: Positive for chest pain and palpitations. Negative for leg swelling.  Gastrointestinal: Negative for abdominal distention, abdominal pain, diarrhea, nausea and vomiting.  Genitourinary: Negative for dysuria, frequency, hematuria and urgency.  Musculoskeletal: Negative for arthralgias, myalgias, neck pain and neck stiffness.  Skin: Negative for rash.  Allergic/Immunologic: Negative for immunocompromised state.  Neurological: Negative for dizziness, weakness, light-headedness, numbness and headaches.  All other systems reviewed and are negative.    Physical Exam Updated Vital Signs BP (!) 158/98 (BP Location: Right Arm)   Pulse (!) 102   Temp 98.2 F (36.8 C) (Oral)   Resp 20   Ht 6' (1.829 m)   Wt 106.6 kg (235 lb)   SpO2 97%   BMI 31.87 kg/m   Physical Exam  Constitutional: He appears well-developed and well-nourished. No distress.  HENT:  Head: Normocephalic and atraumatic.  Eyes: Conjunctivae are normal.  Neck: Neck supple.  Cardiovascular: Normal rate, regular rhythm and normal heart sounds.  Pulmonary/Chest: Effort normal. No respiratory distress. He has no wheezes. He has no rales.  Abdominal: Soft. Bowel sounds are normal. He exhibits no distension. There is no tenderness. There is no rebound.  Musculoskeletal: He exhibits no edema.  Neurological: He is alert.  Skin: Skin is warm and dry.  Nursing note and vitals reviewed.    ED Treatments / Results  Labs (all labs ordered are listed, but only abnormal results are displayed) Labs Reviewed  BASIC METABOLIC PANEL - Abnormal; Notable for the  following components:      Result Value   Anion gap 17 (*)    All other components within normal limits  D-DIMER, QUANTITATIVE (NOT AT [facility]) - Abnormal; Notable for the following components:   D-Dimer, Quant 0.74 (*)    All other components within normal limits  CBC  I-STAT TROPONIN, ED  I-STAT TROPONIN, ED    EKG EKG Interpretation  Date/Time:  Monday June 30 2018 09:59:38 EDT Ventricular Rate:  98 PR Interval:    QRS Duration: 97 QT Interval:  332 QTC Calculation: 424 R Axis:   88 Text Interpretation:  Sinus rhythm No old tracing to compare Confirmed by Raeford Razor [phone]) on 06/30/2018 10:04:22 AM   Radiology Dg Chest 2 View  Result Date: 06/30/2018 CLINICAL DATA:  42 year old male with a history of chest pain for 2 days EXAM: CHEST - 2 VIEW COMPARISON:  None. FINDINGS: Cardiomediastinal silhouette within normal limits in size and contour. No evidence of central vascular congestion. No interlobular septal thickening. No confluent airspace disease. No pleural effusion or pneumothorax. No acute displaced fracture. IMPRESSION: Negative for acute cardiopulmonary disease Electronically Signed   By: Gilmer Mor D.O.   On: 06/30/2018 10:52   Ct Angio Chest Pe W And/or Wo Contrast  Result Date: 06/30/2018 CLINICAL DATA:  Chest palpitations, suspect pulmonary embolism. Positive D-dimer EXAM: CT ANGIOGRAPHY CHEST WITH CONTRAST TECHNIQUE: Multidetector CT imaging of the chest was performed using the standard protocol during bolus administration of intravenous contrast. Multiplanar CT image reconstructions and MIPs were obtained to evaluate the vascular anatomy. CONTRAST:  ISOVUE-370 IOPAMIDOL (ISOVUE-370) INJECTION 76% COMPARISON:  Chest two-view 06/30/2018 FINDINGS: Cardiovascular: Negative for pulmonary embolism. Heart size normal. No pericardial effusion. Normal thoracic aorta. Negative for coronary calcification Mediastinum/Nodes: Negative for mass or adenopathy Lungs/Pleura: Lungs  are well aerated and clear. No infiltrate effusion or mass. Upper Abdomen: Fatty infiltration of the liver. No mass in the upper abdomen. Musculoskeletal: Negative Review of the MIP images confirms the above findings. IMPRESSION: Negative for pulmonary embolism. No significant abnormality in the chest. Fatty infiltration of the liver. Electronically Signed   By: Marlan Palau M.D.   On: 06/30/2018 11:50    Procedures Procedures (including critical care time)  Medications Ordered in ED Medications - No data to display   Initial Impression / Assessment and Plan / ED Course  I have reviewed the triage vital signs and the nursing notes.  Pertinent labs & imaging results that were available during my care of the patient were reviewed by me and considered in my medical decision making (see chart for details).     Patient with left-sided chest pain that comes and goes, some shortness of breath yesterday, palpitations for the last 2 days.  EKG unremarkable.  He is mildly tachycardic.  He does have history of DVT in the past, currently not anticoagulated.  Will check labs including troponin and d-dimer, will do  a chest x-ray, monitor for arrhythmias.  D-dimer slightly elevated.  CT angios obtained and is negative.  Will monitor and repeat second troponin.  2:03 PM Second troponin is negative.  Patient has not had any arrhythmias while in emergency department.  He is in no acute distress.  He does admit to having anxiety and wonders if that could be the cause of his symptoms.  We discussed discharge home, close outpatient follow-up.  He may need to follow-up with cardiology regarding his palpitations.  Return precautions discussed.  Stable for discharge home.  Vital signs are normal.  Vitals:   06/30/18 1130 06/30/18 1313 06/30/18 1330 06/30/18 1400  BP: 122/78 119/89 121/81 118/80  Pulse: 77 77 72 70  Resp: 20 14 (!) 21 19  Temp:      TempSrc:      SpO2: 94% 96% 95% 96%  Weight:      Height:          Final Clinical Impressions(s) / ED Diagnoses   Final diagnoses:  Palpitations  Chest pain, unspecified type    ED Discharge Orders    None       Jaynie Crumble, PA-C 06/30/18 1444    Raeford Razor, MD 06/30/18 1636

## 2018-06-30 NOTE — ED Triage Notes (Signed)
Patient c/o having intermittent chest palpitations x 2 days, but more constant today and left chest pain that he rates 2/10.

## 2018-06-30 NOTE — Discharge Instructions (Addendum)
Avoid caffeinated beverages.  Drink plenty of fluids.  Avoid alcohol.  Please follow-up with cardiology if continue to have palpitations otherwise follow-up with family doctor.  Return if any worsening symptoms.

## 2020-11-07 ENCOUNTER — Telehealth: Payer: Self-pay

## 2020-11-07 NOTE — Telephone Encounter (Signed)
Pt's wife called to schedule pt for a consultation with a new gi but the pt has seen a GI this past August, informed caller to have records sent over first to review by one of our providers.

## 2021-01-16 ENCOUNTER — Ambulatory Visit: Payer: Self-pay | Admitting: General Surgery

## 2021-01-23 ENCOUNTER — Other Ambulatory Visit: Payer: Self-pay

## 2021-01-23 ENCOUNTER — Other Ambulatory Visit (HOSPITAL_COMMUNITY)
Admission: RE | Admit: 2021-01-23 | Discharge: 2021-01-23 | Disposition: A | Discharge status: Home / Self Care | Payer: BC Managed Care – PPO | Source: Ambulatory Visit | Attending: General Surgery | Admitting: General Surgery

## 2021-01-23 DIAGNOSIS — Z01818 Encounter for other preprocedural examination: Secondary | ICD-10-CM | POA: Diagnosis present

## 2021-01-23 DIAGNOSIS — Z87891 Personal history of nicotine dependence: Secondary | ICD-10-CM | POA: Diagnosis not present

## 2021-01-23 DIAGNOSIS — K5190 Ulcerative colitis, unspecified, without complications: Secondary | ICD-10-CM | POA: Diagnosis not present

## 2021-01-23 DIAGNOSIS — Z9049 Acquired absence of other specified parts of digestive tract: Secondary | ICD-10-CM | POA: Diagnosis not present

## 2021-01-23 DIAGNOSIS — Z79899 Other long term (current) drug therapy: Secondary | ICD-10-CM | POA: Diagnosis not present

## 2021-01-23 DIAGNOSIS — Z932 Ileostomy status: Secondary | ICD-10-CM | POA: Diagnosis not present

## 2021-01-23 DIAGNOSIS — Z881 Allergy status to other antibiotic agents status: Secondary | ICD-10-CM | POA: Diagnosis not present

## 2021-01-23 DIAGNOSIS — K6289 Other specified diseases of anus and rectum: Secondary | ICD-10-CM | POA: Diagnosis not present

## 2021-01-23 DIAGNOSIS — Z01812 Encounter for preprocedural laboratory examination: Secondary | ICD-10-CM | POA: Insufficient documentation

## 2021-01-23 DIAGNOSIS — Z20822 Contact with and (suspected) exposure to covid-19: Secondary | ICD-10-CM | POA: Insufficient documentation

## 2021-01-23 DIAGNOSIS — K5120 Ulcerative (chronic) proctitis without complications: Secondary | ICD-10-CM | POA: Diagnosis not present

## 2021-01-23 LAB — SARS CORONAVIRUS 2 (TAT 6-24 HRS): SARS Coronavirus 2: NEGATIVE

## 2021-01-26 ENCOUNTER — Ambulatory Visit (HOSPITAL_COMMUNITY)
Admission: RE | Admit: 2021-01-26 | Discharge: 2021-01-26 | Disposition: A | Discharge status: Home / Self Care | Payer: BC Managed Care – PPO | Attending: General Surgery | Admitting: General Surgery

## 2021-01-26 ENCOUNTER — Encounter (HOSPITAL_COMMUNITY)
Admission: RE | Disposition: A | Discharge status: Home / Self Care | Payer: Self-pay | Source: Home / Self Care | Attending: General Surgery

## 2021-01-26 ENCOUNTER — Other Ambulatory Visit: Payer: Self-pay

## 2021-01-26 ENCOUNTER — Encounter (HOSPITAL_COMMUNITY): Payer: Self-pay | Admitting: General Surgery

## 2021-01-26 DIAGNOSIS — Z932 Ileostomy status: Secondary | ICD-10-CM | POA: Insufficient documentation

## 2021-01-26 DIAGNOSIS — K6289 Other specified diseases of anus and rectum: Secondary | ICD-10-CM | POA: Insufficient documentation

## 2021-01-26 DIAGNOSIS — Z881 Allergy status to other antibiotic agents status: Secondary | ICD-10-CM | POA: Insufficient documentation

## 2021-01-26 DIAGNOSIS — Z01818 Encounter for other preprocedural examination: Secondary | ICD-10-CM | POA: Diagnosis not present

## 2021-01-26 DIAGNOSIS — K5120 Ulcerative (chronic) proctitis without complications: Secondary | ICD-10-CM | POA: Insufficient documentation

## 2021-01-26 DIAGNOSIS — Z87891 Personal history of nicotine dependence: Secondary | ICD-10-CM | POA: Insufficient documentation

## 2021-01-26 DIAGNOSIS — K5190 Ulcerative colitis, unspecified, without complications: Secondary | ICD-10-CM | POA: Insufficient documentation

## 2021-01-26 DIAGNOSIS — Z20822 Contact with and (suspected) exposure to covid-19: Secondary | ICD-10-CM | POA: Insufficient documentation

## 2021-01-26 DIAGNOSIS — Z9049 Acquired absence of other specified parts of digestive tract: Secondary | ICD-10-CM | POA: Insufficient documentation

## 2021-01-26 DIAGNOSIS — Z79899 Other long term (current) drug therapy: Secondary | ICD-10-CM | POA: Insufficient documentation

## 2021-01-26 HISTORY — PX: BIOPSY: SHX5522

## 2021-01-26 HISTORY — PX: FLEXIBLE SIGMOIDOSCOPY: SHX5431

## 2021-01-26 SURGERY — SIGMOIDOSCOPY, FLEXIBLE
Anesthesia: Moderate Sedation

## 2021-01-26 MED ORDER — MIDAZOLAM HCL (PF) 10 MG/2ML IJ SOLN
INTRAMUSCULAR | Status: DC | PRN
  Administered 2021-01-26 (×2): 2 mg via INTRAVENOUS

## 2021-01-26 MED ORDER — FENTANYL CITRATE (PF) 100 MCG/2ML IJ SOLN
INTRAMUSCULAR | Status: DC | PRN
  Administered 2021-01-26 (×2): 25 ug via INTRAVENOUS

## 2021-01-26 MED ORDER — MIDAZOLAM HCL (PF) 5 MG/ML IJ SOLN
INTRAMUSCULAR | Status: AC
  Filled 2021-01-26: qty 1

## 2021-01-26 MED ORDER — SODIUM CHLORIDE 0.9% FLUSH: 3.0000 mL | Freq: Two times a day (BID) | INTRAVENOUS | Status: DC

## 2021-01-26 MED ORDER — SODIUM CHLORIDE 0.9 % IV SOLN: INTRAVENOUS | Status: DC

## 2021-01-26 MED ORDER — FENTANYL CITRATE (PF) 100 MCG/2ML IJ SOLN
INTRAMUSCULAR | Status: AC
  Filled 2021-01-26: qty 2

## 2021-01-26 NOTE — Discharge Instructions (Signed)
Post Colonoscopy Instructions ° °1. DIET: Follow a light bland diet the first 24 hours after arrival home, such as soup, liquids, crackers, etc.  Be sure to include lots of fluids daily.  Avoid fast food or heavy meals as your are more likely to get nauseated.   °2. You may have some mild rectal bleeding for the first few days after the procedure.  This should get less and less with time.  Resume any blood thinners 2 days after your procedure unless directed otherwise by your physician. °3. Take your usually prescribed home medications unless otherwise directed. °a. If you have any pain, it is helpful to get up and walk around, as it is usually from excess gas. °b. If this is not helpful, you can take an over-the-counter pain medication.  Choose one of the following that works best for you: °i. Naproxen (Aleve, etc)  Two 220mg tabs twice a day °ii. Ibuprofen (Advil, etc) Three 200mg tabs four times a day (every meal & bedtime) °iii. If you still have pain after using one of these, please call the office °4. It is normal to not have a bowel movement for 2-3 days after colonoscopy.   ° °5. ACTIVITIES as tolerated:   °6. You may resume regular (light) daily activities beginning the next day--such as daily self-care, walking, climbing stairs--gradually increasing activities as tolerated.  ° ° °WHEN TO CALL US [phone]: °1. Fever over 101.5 F (38.5 C)  °2. Severe abdominal or chest pain  °3. Large amount of rectal bleeding, passing multiple blood clots  °4. Dizziness or shortness of breath °5. Increasing nausea or vomiting ° ° The clinic staff is available to answer your questions during regular business hours (8:30am-5pm).  Please don’t hesitate to call and ask to speak to one of our nurses for clinical concerns.  ° If you have a medical emergency, go to the nearest emergency room or call 911. ° A surgeon from [facility] is always on call at the hospitals ° ° °[facility] °[address], [address], [place], [place]  [address] ? °MAIN: [phone] ? TOLL FREE: [phone] ?  °FAX [phone] °[email or web address] ° ° °

## 2021-01-26 NOTE — H&P (Signed)
The patient is a 45 year old male presenting for a post-operative concern. 45 year old male who presents to the office today for evaluation of ileostomy reversal. He has a history of Crohn's disease diagnosed in 2007. His colonoscopy in 2018 showed pancolitis. He has been managed on 6 MP, steroids and Humira. He did well on Humira. Unfortunately, he developed a perforation of his colon during surveillance colonoscopy in July 2021. 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 He was hospitalized for approximately 9 days and then rehospitalized approximately 1 week later with an intra-abdominal abscess requiring IV antibiotics and CT-guided drain with another 5 day hospital course. Since that time he has been recovering well. 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 He is not on any treatment for his ulcerative colitis currently.   Past Surgical History Micheal Likens, CMA; 01/16/2021 9:08 AM) Colon Polyp Removal - Colonoscopy  Colon Removal - Complete   Diagnostic Studies History (Chanel Lonni Fix, CMA; 01/16/2021 9:08 AM) Colonoscopy  within last year  Allergies (Chanel Lonni Fix, CMA; 01/16/2021 9:09 AM) Erythromycin *DERMATOLOGICALS*  Allergies Reconciled   Medication History (Chanel Lonni Fix, CMA; 01/16/2021 9:09 AM) FLUoxetine HCl (10MG  Capsule, Oral) Active. Medications Reconciled  Social History , CMA; 01/16/2021 9:08 AM) Alcohol use  Occasional alcohol use. Caffeine use  Coffee. No drug use  Tobacco use  Former smoker.  Family History (Chanel 01/18/2021, CMA; 01/16/2021 9:08 AM) First Degree Relatives  No pertinent family history   Other Problems (Chanel 01/18/2021, CMA; 01/16/2021 9:08 AM) Crohn's Disease  Ulcerative Colitis     Review of Systems (Chanel Nolan CMA; 01/16/2021 9:09 AM) General Not Present- Appetite Loss, Chills, Fatigue, Fever, Night Sweats, Weight Gain and Weight Loss. Skin Not  Present- Change in Wart/Mole, Dryness, Hives, Jaundice, New Lesions, Non-Healing Wounds, Rash and Ulcer. HEENT Not Present- Earache, Hearing Loss, Hoarseness, Nose Bleed, Oral Ulcers, Ringing in the Ears, Seasonal Allergies, Sinus Pain, Sore Throat, Visual Disturbances, Wears glasses/contact lenses and Yellow Eyes. Respiratory Not Present- Bloody sputum, Chronic Cough, Difficulty Breathing, Snoring and Wheezing. Breast Not Present- Breast Mass, Breast Pain, Nipple Discharge and Skin Changes. Cardiovascular Not Present- Chest Pain, Difficulty Breathing Lying Down, Leg Cramps, Palpitations, Rapid Heart Rate, Shortness of Breath and Swelling of Extremities. Gastrointestinal Not Present- Abdominal Pain, Bloating, Bloody Stool, Change in Bowel Habits, Chronic diarrhea, Constipation, Difficulty Swallowing, Excessive gas, Gets full quickly at meals, Hemorrhoids, Indigestion, Nausea, Rectal Pain and Vomiting. Male Genitourinary Not Present- Blood in Urine, Change in Urinary Stream, Frequency, Impotence, Nocturia, Painful Urination, Urgency and Urine Leakage.  Vitals (Chanel Nolan CMA; 01/16/2021 9:10 AM) 01/16/2021 9:09 AM Weight: 259.13 lb Height: 73in Body Surface Area: 2.4 m Body Mass Index: 34.19 kg/m  Temp.: 97.75F  Pulse: 113 (Regular)  BP: 110/72(Sitting, Left Arm, Standard)       Physical Exam General Mental Status-Alert. General Appearance-Cooperative. CV: RRR Lungs: CTA Abdomen Inspection Skin - Skin - Scar - Note: Large abdominal scar throughout the entire abdomen, well-healed. Ileostomy - Right Lower Quadrant. Palpation/Percussion Palpation and Percussion of the abdomen reveal - Soft and Non Tender.    Assessment & Plan 01/18/2021 MD; 01/16/2021 9:37 AM) 01/18/2021 ULCERATIVE COLITIS WITHOUT COMPLICATION (K51.90) Impression: Patient with a history of ulcerative colitis. We discussed surgical options for reversing his ileostomy. These include ileorectal  anastomosis if there is no inflammation of the rectum noted on endoscopy versus proctectomy and J-pouch creation with loop ileostomy. We discussed the risk and benefits of  these procedures in detail. All questions were answered. We will proceed with endoscopic surveillance of his rectum as the next step. We will take biopsies of any chronically inflamed areas. We will then decide on a likely procedure going forward.

## 2021-01-26 NOTE — Op Note (Signed)
[facility] Patient Name: Brian Blackburn Procedure Date: 01/26/2021 MRN: [id number] Attending MD: Romie Levee , MD Date of Birth: 1976-08-26 CSN: [id number] Age: 46 Admit Type: Outpatient Procedure:                Flexible Sigmoidoscopy Indications:              Preoperative assessment Providers:                Romie Levee, MD, Dwain Sarna, RN, Michele Mcalpine                            Technician Referring MD:              Medicines:                Fentanyl 50 micrograms IV, Midazolam 4 mg IV,                            Monitored Anesthesia Care Complications:            No immediate complications. Estimated blood loss:                            Minimal. Estimated Blood Loss:     Estimated blood loss was minimal. Procedure:                Pre-Anesthesia Assessment:                           - Prior to the procedure, a History and Physical                            was performed, and patient medications and                            allergies were reviewed. The patient's tolerance of                            previous anesthesia was also reviewed. The risks                            and benefits of the procedure and the sedation                            options and risks were discussed with the patient.                            All questions were answered, and informed consent                            was obtained. Prior Anticoagulants: The patient has                            taken no previous anticoagulant or antiplatelet                            agents. ASA Grade Assessment: II -  A patient with                            mild systemic disease. After reviewing the risks                            and benefits, the patient was deemed in                            satisfactory condition to undergo the procedure.                           After obtaining informed consent, the scope was                            passed under direct vision. The  CF-HQ190L ([id number])                            Olympus colonoscope was introduced through the anus                            and advanced to the the rectum. The flexible                            sigmoidoscopy was accomplished without difficulty.                            The patient tolerated the procedure well. The                            quality of the bowel preparation was adequate. Scope In: Scope Out: Findings:      The perianal and digital rectal examinations were normal. Pertinent       negatives include normal sphincter tone.      Diffuse moderate inflammation characterized by erosions, erythema and       friability was found in the rectum. Biopsies were taken with a cold       forceps for histology. Estimated blood loss was minimal. 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 Biopsied. Moderate Sedation:      Moderate (conscious) sedation was administered by the endoscopy nurse       and supervised by the endoscopist. The following parameters were       monitored: oxygen saturation, heart rate, blood pressure, and response       to care. Recommendation:           - Continue present medications. Procedure Code(s):        --- Professional ---                           [phone], 52, Sigmoidoscopy, flexible; with biopsy,  single or multiple Diagnosis Code(s):        --- Professional ---                           K35.465, Encounter for other preprocedural                            examination                           K62.89, Other specified diseases of anus and rectum CPT copyright 2019 American Medical Association. All rights reserved. The codes documented in this report are preliminary and upon coder review may  be revised to meet current compliance requirements. Romie Levee, MD Romie Levee, MD 01/26/2021 7:55:39 AM This report has been signed  electronically. Number of Addenda: 0

## 2021-01-27 ENCOUNTER — Encounter (HOSPITAL_COMMUNITY): Payer: Self-pay | Admitting: General Surgery

## 2021-01-27 LAB — SURGICAL PATHOLOGY

## 2021-02-15 ENCOUNTER — Ambulatory Visit: Payer: Self-pay | Admitting: General Surgery

## 2021-02-15 NOTE — H&P (Signed)
The patient is a 45 year old male presenting for a post-operative concern. 45 year old male who presents to the office today for evaluation of ileostomy reversal. He has a history of Crohn's disease diagnosed in 2007. His colonoscopy in 2018 showed pancolitis. He has been managed on 6 MP, steroids and Humira. He did well on Humira. Unfortunately, he developed a perforation of his colon during surveillance colonoscopy in July 2021. 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 He was hospitalized for approximately 9 days and then rehospitalized approximately 1 week later with an intra-abdominal abscess requiring IV antibiotics and CT-guided drain with another 5 day hospital course. Since that time he has been recovering well. 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 He is not on any treatment for his ulcerative colitis currently. We performed a flexible proctoscopy which showed some mild inflammation of the rectum. Biopsies are consistent with ulcerative colitis without dysplasia or granulomas.    Problem List/Past Medical Romie Levee, MD; 02/15/2021 4:06 PM) QUIESCENT ULCERATIVE COLITIS WITHOUT COMPLICATION (K51.90)  Past Surgical History Romie Levee, MD; 02/15/2021 4:06 PM) Colon Polyp Removal - Colonoscopy Colon Removal - Complete  Diagnostic Studies History Romie Levee, MD; 02/15/2021 4:06 PM) Colonoscopy within last year  Allergies Michel Bickers, LPN; 5/36/6440 3:47 PM) Erythromycin *DERMATOLOGICALS* Allergies Reconciled  Medication History Michel Bickers, LPN; 04/17/9562 8:75 PM) FLUoxetine HCl (10MG  Capsule, Oral) Active. Medications Reconciled  Social History , MD; 02/15/2021 4:06 PM) Alcohol use Occasional alcohol use. Caffeine use Coffee. No drug use Tobacco use Former smoker.  Family History 02/17/2021, MD; 02/15/2021 4:06 PM) First Degree Relatives No pertinent  family history  Other Problems 02/17/2021, MD; 02/15/2021 4:06 PM) Crohn's Disease Ulcerative Colitis     Review of Systems 02/17/2021 MD; 02/15/2021 4:06 PM) General Not Present- Appetite Loss, Chills, Fatigue, Fever, Night Sweats, Weight Gain and Weight Loss. Skin Not Present- Change in Wart/Mole, Dryness, Hives, Jaundice, New Lesions, Non-Healing Wounds, Rash and Ulcer. HEENT Not Present- Earache, Hearing Loss, Hoarseness, Nose Bleed, Oral Ulcers, Ringing in the Ears, Seasonal Allergies, Sinus Pain, Sore Throat, Visual Disturbances, Wears glasses/contact lenses and Yellow Eyes. Respiratory Not Present- Bloody sputum, Chronic Cough, Difficulty Breathing, Snoring and Wheezing. Breast Not Present- Breast Mass, Breast Pain, Nipple Discharge and Skin Changes. Cardiovascular Not Present- Chest Pain, Difficulty Breathing Lying Down, Leg Cramps, Palpitations, Rapid Heart Rate, Shortness of Breath and Swelling of Extremities. Gastrointestinal Not Present- Abdominal Pain, Bloating, Bloody Stool, Change in Bowel Habits, Chronic diarrhea, Constipation, Difficulty Swallowing, Excessive gas, Gets full quickly at meals, Hemorrhoids, Indigestion, Nausea, Rectal Pain and Vomiting. Male Genitourinary Not Present- Blood in Urine, Change in Urinary Stream, Frequency, Impotence, Nocturia, Painful Urination, Urgency and Urine Leakage.   Physical Exam 02/17/2021 MD; 02/15/2021 4:06 PM)  General Mental Status-Alert. General Appearance-Cooperative.  Abdomen Inspection Skin - Skin - Scar - Note: Large abdominal scar throughout the entire abdomen, well-healed. Ileostomy - Right Lower Quadrant. Palpation/Percussion Palpation and Percussion of the abdomen reveal - Soft and Non Tender.    Assessment & Plan 02/17/2021 MD; 02/15/2021 4:07 PM)  QUIESCENT ULCERATIVE COLITIS WITHOUT COMPLICATION (K51.90) Impression: 45 year old male status post ulcerative colitis with perforation after  colonoscopy. Flexible proctoscopy shows continued active inflammation consistent with ulcerative colitis on biopsy. I have recommended proctectomy and J-pouch. We have discussed this in detail including risk of pouch failure and small risk of Crohn's disease. We have discussed typical side effects of rectal surgery including  difficulty with urinary and sexual function. We have discussed bowel habits after surgery and typical recovery times. We discussed the need for diverting loop ileostomy and a second surgery for reversal, once the pouch heals. The surgery and anatomy were described to the patient as well as the risks of surgery and the possible complications. These include: Bleeding, deep abdominal infections and possible wound complications such as hernia and infection, damage to adjacent structures, leak of surgical connections, which can lead to other surgeries and possibly an ostomy, possible need for other procedures, such as abscess drains in radiology, possible prolonged hospital stay, possible diarrhea from removal of part of the colon, possible constipation from narcotics, possible bowel, bladder or sexual dysfunction if having rectal surgery, prolonged fatigue/weakness or appetite loss, possible early recurrence of of disease, possible complications of their medical problems such as heart disease or arrhythmias or lung problems, death (less than 1%). I believe the patient understands and wishes to proceed with the surgery.

## 2021-04-11 ENCOUNTER — Other Ambulatory Visit (HOSPITAL_COMMUNITY): Payer: BC Managed Care – PPO

## 2021-05-01 ENCOUNTER — Other Ambulatory Visit: Payer: Self-pay

## 2021-05-01 ENCOUNTER — Encounter (HOSPITAL_COMMUNITY): Payer: Self-pay

## 2021-05-01 ENCOUNTER — Encounter (HOSPITAL_COMMUNITY)
Admission: RE | Admit: 2021-05-01 | Discharge: 2021-05-01 | Disposition: A | Discharge status: Home / Self Care | Payer: BC Managed Care – PPO | Source: Ambulatory Visit | Attending: General Surgery | Admitting: General Surgery

## 2021-05-01 DIAGNOSIS — Z01812 Encounter for preprocedural laboratory examination: Secondary | ICD-10-CM | POA: Diagnosis present

## 2021-05-01 HISTORY — DX: Nausea with vomiting, unspecified: R11.2

## 2021-05-01 HISTORY — DX: Other specified postprocedural states: Z98.890

## 2021-05-01 LAB — BASIC METABOLIC PANEL
Anion gap: 8 (ref 5–15)
BUN: 18 mg/dL (ref 6–20)
CO2: 23 mmol/L (ref 22–32)
Calcium: 9.7 mg/dL (ref 8.9–10.3)
Chloride: 110 mmol/L (ref 98–111)
Creatinine, Ser: 0.94 mg/dL (ref 0.61–1.24)
GFR, Estimated: >60 mL/min (ref >60)
Glucose, Bld: 111 mg/dL — ABNORMAL HIGH (ref 70–99)
Potassium: 3.9 mmol/L (ref 3.5–5.1)
Sodium: 141 mmol/L (ref 135–145)

## 2021-05-01 LAB — TYPE AND SCREEN
ABO/RH(D): A POS
Antibody Screen: NEGATIVE

## 2021-05-01 LAB — CBC
HCT: 51.4 % (ref 39.0–52.0)
Hemoglobin: 17.2 g/dL — ABNORMAL HIGH (ref 13.0–17.0)
MCH: 30.9 pg (ref 26.0–34.0)
MCHC: 33.5 g/dL (ref 30.0–36.0)
MCV: 92.4 fL (ref 80.0–100.0)
Platelets: 235 10*3/uL (ref 150–400)
RBC: 5.56 MIL/uL (ref 4.22–5.81)
RDW: 12.8 % (ref 11.5–15.5)
WBC: 9 10*3/uL (ref 4.0–10.5)
nRBC: 0 % (ref 0.0–0.2)

## 2021-05-01 NOTE — Progress Notes (Addendum)
Anesthesia Review:  PCP:   Azzie Roup, NP  LOV 10/2020  Cardiologist : Chest x-ray : EKG : Echo : Stress test: Cardiac Cath :  Activity level: can do a flight of stairs without difficulty  Sleep Study/ CPAP : none  Fasting Blood Sugar :      / Checks Blood Sugar -- times a day:   Blood Thinner/ Instructions /Last Dose: ASA / Instructions/ Last Dose : CBC done 05/01/21 routed to DR Romie Levee.

## 2021-05-01 NOTE — Consult Note (Signed)
WOC consulted for ostomy preoperative marking.  Patient has current ileostomy.  Plans for Jpouch and diverting ostomy but not a new stoma, if so will be in the same location (generally).  Discussed with Dr. Maisie Fus, will not need marking today. Patient is very pleasant and understanding.  We will follow up after surgery if needed for any assistance with ostomy care/supplies related to stoma.   Analyse [facility], CNS, [employer] [phone]

## 2021-05-01 NOTE — Progress Notes (Signed)
Anesthesia Review:  PCP:  DR Azzie Roup- LOV 11/10/2020 Cardiologist : Chest x-ray : EKG : Echo : Stress test: Cardiac Cath :  Activity level: can do a flight of stairs without difficulty  Sleep Study/ CPAP : none  Fasting Blood Sugar :      / Checks Blood Sugar -- times a day:   Blood Thinner/ Instructions /Last Dose: ASA / Instructions/ Last Dose :  Order in for pt to follow bowel prep instructions.  Called office of [facility] while pt here for preop on 05/01/21.  They told nurse to come by and pick up bowel prep instructions at front desk of [facility] office.  This info was relayed to pt.   OFficce of [facility] called back again Marcelino Duster) and stated that DR Maisie Fus stated he did not need bowel Prep but to have the 3 ERAS drinks.  This info was relayed to pt and placed on instructions sheet.  PT voiced understanding.

## 2021-05-01 NOTE — Progress Notes (Signed)
DUE TO COVID-19 ONLY ONE VISITOR IS ALLOWED TO COME WITH YOU AND STAY IN THE WAITING ROOM ONLY DURING PRE OP AND PROCEDURE DAY OF SURGERY. THE 1 VISITOR  MAY VISIT WITH YOU AFTER SURGERY IN YOUR PRIVATE ROOM DURING VISITING HOURS ONLY!  YOU NEED TO HAVE A COVID 19 TEST ON_5/17/2022 ______ @_______ , THIS TEST MUST BE DONE BEFORE SURGERY,  COVID TESTING SITE [address] [place] , IT IS ON THE RIGHT GOING OUT [address] APPROXIMATELY  2 MINUTES PAST [employer] ON THE RIGHT. ONCE YOUR COVID TEST IS COMPLETED,  PLEASE BEGIN THE QUARANTINE INSTRUCTIONS AS OUTLINED IN YOUR HANDOUT.                Brian Blackburn  05/01/2021   Your procedure is scheduled on:  05/12/2021   Report to [facility] Main  Entrance   Report to admitting at 0515    AM     Call this number if you have problems the morning of surgery [phone]           REMEMBER: FOLLOW ALL YOUR BOWEL PREP INSTRUCTIONS WITH CLEAR LIQUIDS FROM YOUR SURGEON'S INSTRUCTIONS. DRINK 2 PRESURGERY ENSURE DRINKS THE NIGHT BEFORE SURGERY AT 1000 PM AND 1 PRESURGERY DRINK THE DAY OF THE PROCEDURE 3 HOURS PRIOR TO SCHEDULED SURGERY.  NOTHING BY MOUTH EXCEPT CLEAR LIQUIDS UNTIL THREE HOURS PRIOR TO SCHEDULED SURGERY. PLEASE FINISH PRESURGERY 3RD  ENSURE  DRINK PER SURGEON ORDER 3 HOURS PRIOR TO SCHEDULED SURGERY TIME WHICH NEEDS TO BE COMPLETED AT ___0430AM ______.     CLEAR LIQUID DIET   Foods Allowed                                                                      Coffee and tea, regular and decaf                           Plain Jell-O any favor except red or purple                                         Fruit ices (not with fruit pulp)                                      Iced Popsicles                                     Carbonated beverages, regular and diet                                    Cranberry, grape and apple juices Sports drinks like Gatorade Lightly seasoned clear broth or  consume(fat free) Sugar, honey syrup  _____________________________________________________________________      BRUSH YOUR TEETH MORNING OF SURGERY AND RINSE YOUR MOUTH OUT, NO CHEWING GUM CANDY OR MINTS.     Take these medicines  the morning of surgery with A SIP OF WATER:  ALLEGRA   DO NOT TAKE ANY DIABETIC MEDICATIONS DAY OF YOUR SURGERY                               You may not have any metal on your body including hair pins and              piercings  Do not wear jewelry, make-up, lotions, powders or perfumes, deodorant             Do not wear nail polish on your fingernails.  Do not shave  48 hours prior to surgery.              Men may shave face and neck.   Do not bring valuables to the hospital. [place] IS NOT             RESPONSIBLE   FOR VALUABLES.  Contacts, dentures or bridgework may not be worn into surgery.  Leave suitcase in the car. After surgery it may be brought to your room.                 Please read over the following fact sheets you were given: _____________________________________________________________________  [facility] - Preparing for Surgery Before surgery, you can play an important role.  Because skin is not sterile, your skin needs to be as free of germs as possible.  You can reduce the number of germs on your skin by washing with CHG (chlorahexidine gluconate) soap before surgery.  CHG is an antiseptic cleaner which kills germs and bonds with the skin to continue killing germs even after washing. Please DO NOT use if you have an allergy to CHG or antibacterial soaps.  If your skin becomes reddened/irritated stop using the CHG and inform your nurse when you arrive at Short Stay. Do not shave (including legs and underarms) for at least 48 hours prior to the first CHG shower.  You may shave your face/neck. Please follow these instructions carefully:  1.  Shower with CHG Soap the night before surgery and the  morning of Surgery.  2.  If you choose  to wash your hair, wash your hair first as usual with your  normal  shampoo.  3.  After you shampoo, rinse your hair and body thoroughly to remove the  shampoo.                           4.  Use CHG as you would any other liquid soap.  You can apply chg directly  to the skin and wash                       Gently with a scrungie or clean washcloth.  5.  Apply the CHG Soap to your body ONLY FROM THE NECK DOWN.   Do not use on face/ open                           Wound or open sores. Avoid contact with eyes, ears mouth and genitals (private parts).                       Wash face,  Genitals (private parts) with your normal soap.  6.  Wash thoroughly, paying special attention to the area where your surgery  will be performed.  7.  Thoroughly rinse your body with warm water from the neck down.  8.  DO NOT shower/wash with your normal soap after using and rinsing off  the CHG Soap.                9.  Pat yourself dry with a clean towel.            10.  Wear clean pajamas.            11.  Place clean sheets on your bed the night of your first shower and do not  sleep with pets. Day of Surgery : Do not apply any lotions/deodorants the morning of surgery.  Please wear clean clothes to the hospital/surgery center.  FAILURE TO FOLLOW THESE INSTRUCTIONS MAY RESULT IN THE CANCELLATION OF YOUR SURGERY PATIENT SIGNATURE_________________________________  NURSE SIGNATURE__________________________________  ________________________________________________________________________

## 2021-05-09 ENCOUNTER — Other Ambulatory Visit (HOSPITAL_COMMUNITY): Payer: BC Managed Care – PPO

## 2021-06-09 ENCOUNTER — Ambulatory Visit: Payer: Self-pay | Admitting: General Surgery

## 2021-06-09 NOTE — Progress Notes (Signed)
Pt. Needs orders for upcomming surgery.PAT and labs on 06/12/21. 

## 2021-06-09 NOTE — Patient Instructions (Addendum)
DUE TO COVID-19 ONLY ONE VISITOR IS ALLOWED TO COME WITH YOU AND STAY IN THE WAITING ROOM ONLY DURING PRE OP AND PROCEDURE DAY OF SURGERY. THE 1 VISITOR  MAY VISIT WITH YOU AFTER SURGERY IN YOUR PRIVATE ROOM DURING VISITING HOURS ONLY!                Brian Blackburn   Your procedure is scheduled on: 06/23/21   Report to [facility] Main  Entrance   Report to short stay at : 5:15 AM     Call this number if you have problems the morning of surgery [phone]    Remember: DRINK 2 PRESURGERY ENSURE DRINKS THE NIGHT BEFORE SURGERY AT  1000 PM AND 1 PRESURGERY DRINK THE DAY OF THE PROCEDURE 3 HOURS PRIOR TO SCHEDULED SURGERY. NO SOLIDS AFTER MIDNIGHT THE DAY PRIOR TO THE SURGERY. NOTHING BY MOUTH EXCEPT CLEAR LIQUIDS UNTIL THREE HOURS PRIOR TO SCHEDULED SURGERY. PLEASE FINISH PRESURGERY ENSURE DRINK PER SURGEON ORDER 3 HOURS PRIOR TO SCHEDULED SURGERY TIME WHICH NEEDS TO BE COMPLETED AT: 4:15 AM.   CLEAR LIQUID DIET  Foods Allowed                                                                     Foods Excluded  Coffee and tea, regular and decaf                             liquids that you cannot  Plain Jell-O any favor except red or purple                                           see through such as: Fruit ices (not with fruit pulp)                                     milk, soups, orange juice  Iced Popsicles                                    All solid food Carbonated beverages, regular and diet                                    Cranberry, grape and apple juices Sports drinks like Gatorade Lightly seasoned clear broth or consume(fat free) Sugar, honey syrup  Sample Menu Breakfast                                Lunch                                     Supper Cranberry juice                    Beef broth  Chicken broth Jell-O                                     Grape juice                           Apple juice Coffee or tea                         Jell-O                                      Popsicle                                                Coffee or tea                        Coffee or tea  _____________________________________________________________________   BRUSH YOUR TEETH MORNING OF SURGERY AND RINSE YOUR MOUTH OUT, NO CHEWING GUM CANDY OR MINTS.                               You may not have any metal on your body including hair pins and              piercings  Do not wear jewelry,lotions, powders or perfumes, deodorant             Men may shave face and neck.   Do not bring valuables to the hospital. [place] IS NOT             RESPONSIBLE   FOR VALUABLES.  Contacts, dentures or bridgework may not be worn into surgery.  Leave suitcase in the car. After surgery it may be brought to your room.     Patients discharged the day of surgery will not be allowed to drive home. IF YOU ARE HAVING SURGERY AND GOING HOME THE SAME DAY, YOU MUST HAVE AN ADULT TO DRIVE YOU HOME AND BE WITH YOU FOR 24 HOURS. YOU MAY GO HOME BY TAXI OR UBER OR ORTHERWISE, BUT AN ADULT MUST ACCOMPANY YOU HOME AND STAY WITH YOU FOR 24 HOURS.  Name and phone number of your driver:  Special Instructions: N/A              Please read over the following fact sheets you were given: _____________________________________________________________________           [facility] - Preparing for Surgery Before surgery, you can play an important role.  Because skin is not sterile, your skin needs to be as free of germs as possible.  You can reduce the number of germs on your skin by washing with CHG (chlorahexidine gluconate) soap before surgery.  CHG is an antiseptic cleaner which kills germs and bonds with the skin to continue killing germs even after washing. Please DO NOT use if you have an allergy to CHG or antibacterial soaps.  If your skin becomes reddened/irritated stop using the CHG and inform your nurse when you arrive at Short Stay. Do not shave  (including legs and underarms)  for at least 48 hours prior to the first CHG shower.  You may shave your face/neck. Please follow these instructions carefully:  1.  Shower with CHG Soap the night before surgery and the  morning of Surgery.  2.  If you choose to wash your hair, wash your hair first as usual with your  normal  shampoo.  3.  After you shampoo, rinse your hair and body thoroughly to remove the  shampoo.                           4.  Use CHG as you would any other liquid soap.  You can apply chg directly  to the skin and wash                       Gently with a scrungie or clean washcloth.  5.  Apply the CHG Soap to your body ONLY FROM THE NECK DOWN.   Do not use on face/ open                           Wound or open sores. Avoid contact with eyes, ears mouth and genitals (private parts).                       Wash face,  Genitals (private parts) with your normal soap.             6.  Wash thoroughly, paying special attention to the area where your surgery  will be performed.  7.  Thoroughly rinse your body with warm water from the neck down.  8.  DO NOT shower/wash with your normal soap after using and rinsing off  the CHG Soap.                9.  Pat yourself dry with a clean towel.            10.  Wear clean pajamas.            11.  Place clean sheets on your bed the night of your first shower and do not  sleep with pets. Day of Surgery : Do not apply any lotions/deodorants the morning of surgery.  Please wear clean clothes to the hospital/surgery center.  FAILURE TO FOLLOW THESE INSTRUCTIONS MAY RESULT IN THE CANCELLATION OF YOUR SURGERY PATIENT SIGNATURE_________________________________  NURSE SIGNATURE__________________________________  ________________________________________________________________________

## 2021-06-12 ENCOUNTER — Encounter (HOSPITAL_COMMUNITY): Payer: Self-pay

## 2021-06-12 ENCOUNTER — Encounter (HOSPITAL_COMMUNITY)
Admission: RE | Admit: 2021-06-12 | Discharge: 2021-06-12 | Disposition: A | Discharge status: Home / Self Care | Payer: BC Managed Care – PPO | Source: Ambulatory Visit | Attending: General Surgery | Admitting: General Surgery

## 2021-06-12 ENCOUNTER — Other Ambulatory Visit: Payer: Self-pay

## 2021-06-12 DIAGNOSIS — Z01812 Encounter for preprocedural laboratory examination: Secondary | ICD-10-CM | POA: Insufficient documentation

## 2021-06-12 LAB — CBC WITH DIFFERENTIAL/PLATELET
Abs Immature Granulocytes: 0.03 10*3/uL (ref 0.00–0.07)
Basophils Absolute: 0 10*3/uL (ref 0.0–0.1)
Basophils Relative: 0 %
Eosinophils Absolute: 0.1 10*3/uL (ref 0.0–0.5)
Eosinophils Relative: 2 %
HCT: 49.8 % (ref 39.0–52.0)
Hemoglobin: 16.5 g/dL (ref 13.0–17.0)
Immature Granulocytes: 0 %
Lymphocytes Relative: 19 %
Lymphs Abs: 1.5 10*3/uL (ref 0.7–4.0)
MCH: 31.3 pg (ref 26.0–34.0)
MCHC: 33.1 g/dL (ref 30.0–36.0)
MCV: 94.5 fL (ref 80.0–100.0)
Monocytes Absolute: 0.5 10*3/uL (ref 0.1–1.0)
Monocytes Relative: 6 %
Neutro Abs: 5.8 10*3/uL (ref 1.7–7.7)
Neutrophils Relative %: 73 %
Platelets: 212 10*3/uL (ref 150–400)
RBC: 5.27 MIL/uL (ref 4.22–5.81)
RDW: 13.2 % (ref 11.5–15.5)
WBC: 8 10*3/uL (ref 4.0–10.5)
nRBC: 0 % (ref 0.0–0.2)

## 2021-06-12 LAB — TYPE AND SCREEN
ABO/RH(D): A POS
Antibody Screen: NEGATIVE

## 2021-06-12 LAB — COMPREHENSIVE METABOLIC PANEL
ALT: 66 U/L — ABNORMAL HIGH (ref 0–44)
AST: 32 U/L (ref 15–41)
Albumin: 4 g/dL (ref 3.5–5.0)
Alkaline Phosphatase: 75 U/L (ref 38–126)
Anion gap: 8 (ref 5–15)
BUN: 18 mg/dL (ref 6–20)
CO2: 24 mmol/L (ref 22–32)
Calcium: 9.7 mg/dL (ref 8.9–10.3)
Chloride: 106 mmol/L (ref 98–111)
Creatinine, Ser: 0.8 mg/dL (ref 0.61–1.24)
GFR, Estimated: >60 mL/min (ref >60)
Glucose, Bld: 100 mg/dL — ABNORMAL HIGH (ref 70–99)
Potassium: 3.8 mmol/L (ref 3.5–5.1)
Sodium: 138 mmol/L (ref 135–145)
Total Bilirubin: 0.6 mg/dL (ref 0.3–1.2)
Total Protein: 7.5 g/dL (ref 6.5–8.1)

## 2021-06-13 LAB — HEMOGLOBIN A1C
Hgb A1c MFr Bld: 5.8 % — ABNORMAL HIGH (ref 4.8–5.6)
Mean Plasma Glucose: 120 mg/dL

## 2021-06-22 MED ORDER — BUPIVACAINE LIPOSOME 1.3 % IJ SUSP
20.0000 mL | INTRAMUSCULAR | Status: DC
  Filled 2021-06-22: qty 20

## 2021-06-22 MED ORDER — SODIUM CHLORIDE 0.9 % IV SOLN
2.0000 g | INTRAVENOUS | Status: AC
  Administered 2021-06-23 (×2): 2 g via INTRAVENOUS
  Filled 2021-06-22: qty 2

## 2021-06-23 ENCOUNTER — Inpatient Hospital Stay (HOSPITAL_COMMUNITY): Payer: BC Managed Care – PPO | Admitting: Certified Registered Nurse Anesthetist

## 2021-06-23 ENCOUNTER — Other Ambulatory Visit: Payer: Self-pay

## 2021-06-23 ENCOUNTER — Encounter (HOSPITAL_COMMUNITY)
Admission: RE | Disposition: A | Discharge status: Home / Self Care | Payer: Self-pay | Source: Other Acute Inpatient Hospital | Attending: General Surgery

## 2021-06-23 ENCOUNTER — Inpatient Hospital Stay (HOSPITAL_COMMUNITY)
Admission: RE | Admit: 2021-06-23 | Discharge: 2021-06-30 | DRG: 330 | Disposition: A | Discharge status: Home / Self Care | Payer: BC Managed Care – PPO | Source: Other Acute Inpatient Hospital | Attending: General Surgery | Admitting: General Surgery

## 2021-06-23 ENCOUNTER — Encounter (HOSPITAL_COMMUNITY): Payer: Self-pay | Admitting: General Surgery

## 2021-06-23 DIAGNOSIS — Z432 Encounter for attention to ileostomy: Principal | ICD-10-CM

## 2021-06-23 DIAGNOSIS — E860 Dehydration: Secondary | ICD-10-CM | POA: Diagnosis not present

## 2021-06-23 DIAGNOSIS — E876 Hypokalemia: Secondary | ICD-10-CM | POA: Diagnosis not present

## 2021-06-23 DIAGNOSIS — K660 Peritoneal adhesions (postprocedural) (postinfection): Secondary | ICD-10-CM | POA: Diagnosis present

## 2021-06-23 DIAGNOSIS — K5190 Ulcerative colitis, unspecified, without complications: Secondary | ICD-10-CM | POA: Diagnosis present

## 2021-06-23 HISTORY — PX: XI ROBOTIC ASSISTED LOWER ANTERIOR RESECTION: SHX6558

## 2021-06-23 HISTORY — PX: LAPAROSCOPIC LYSIS OF ADHESIONS: SHX5905

## 2021-06-23 SURGERY — RESECTION, RECTUM, LOW ANTERIOR, ROBOT-ASSISTED
Anesthesia: General | Site: Abdomen

## 2021-06-23 MED ORDER — DEXAMETHASONE SODIUM PHOSPHATE 10 MG/ML IJ SOLN
INTRAMUSCULAR | Status: AC
  Filled 2021-06-23: qty 1

## 2021-06-23 MED ORDER — MIDAZOLAM HCL 2 MG/2ML IJ SOLN
INTRAMUSCULAR | Status: AC
  Filled 2021-06-23: qty 2

## 2021-06-23 MED ORDER — ORAL CARE MOUTH RINSE: 15.0000 mL | Freq: Once | OROMUCOSAL | Status: AC

## 2021-06-23 MED ORDER — SIMETHICONE 80 MG PO CHEW
40.0000 mg | CHEWABLE_TABLET | Freq: Four times a day (QID) | ORAL | Status: DC | PRN
  Administered 2021-06-24 – 2021-06-26 (×3): 40 mg via ORAL
  Filled 2021-06-23 (×3): qty 1

## 2021-06-23 MED ORDER — ALVIMOPAN 12 MG PO CAPS
12.0000 mg | ORAL_CAPSULE | ORAL | Status: AC
  Administered 2021-06-23: 12 mg via ORAL
  Filled 2021-06-23: qty 1

## 2021-06-23 MED ORDER — ENSURE PRE-SURGERY PO LIQD
296.0000 mL | Freq: Once | ORAL | Status: DC
  Filled 2021-06-23: qty 296

## 2021-06-23 MED ORDER — EPHEDRINE 5 MG/ML INJ
INTRAVENOUS | Status: AC
  Filled 2021-06-23: qty 10

## 2021-06-23 MED ORDER — GABAPENTIN 300 MG PO CAPS
300.0000 mg | ORAL_CAPSULE | ORAL | Status: AC
  Administered 2021-06-23: 300 mg via ORAL
  Filled 2021-06-23: qty 1

## 2021-06-23 MED ORDER — FENTANYL CITRATE (PF) 250 MCG/5ML IJ SOLN
INTRAMUSCULAR | Status: AC
  Filled 2021-06-23: qty 5

## 2021-06-23 MED ORDER — ENSURE SURGERY PO LIQD
237.0000 mL | Freq: Two times a day (BID) | ORAL | Status: DC
  Administered 2021-06-24 – 2021-06-29 (×6): 237 mL via ORAL

## 2021-06-23 MED ORDER — MIDAZOLAM HCL 5 MG/5ML IJ SOLN
INTRAMUSCULAR | Status: DC | PRN
  Administered 2021-06-23: 2 mg via INTRAVENOUS

## 2021-06-23 MED ORDER — LIDOCAINE HCL 2 % IJ SOLN
INTRAMUSCULAR | Status: AC
  Filled 2021-06-23: qty 20

## 2021-06-23 MED ORDER — ROCURONIUM BROMIDE 10 MG/ML (PF) SYRINGE
PREFILLED_SYRINGE | INTRAVENOUS | Status: DC | PRN
  Administered 2021-06-23: 30 mg via INTRAVENOUS
  Administered 2021-06-23: 40 mg via INTRAVENOUS
  Administered 2021-06-23: 50 mg via INTRAVENOUS
  Administered 2021-06-23: 30 mg via INTRAVENOUS
  Administered 2021-06-23: 100 mg via INTRAVENOUS

## 2021-06-23 MED ORDER — DEXAMETHASONE SODIUM PHOSPHATE 4 MG/ML IJ SOLN
INTRAMUSCULAR | Status: DC | PRN
  Administered 2021-06-23: 10 mg via INTRAVENOUS

## 2021-06-23 MED ORDER — FENTANYL CITRATE (PF) 100 MCG/2ML IJ SOLN
INTRAMUSCULAR | Status: AC
  Filled 2021-06-23: qty 2

## 2021-06-23 MED ORDER — DIPHENHYDRAMINE HCL 50 MG/ML IJ SOLN: 25.0000 mg | Freq: Four times a day (QID) | INTRAMUSCULAR | Status: DC | PRN

## 2021-06-23 MED ORDER — ENOXAPARIN SODIUM 40 MG/0.4ML IJ SOSY
40.0000 mg | PREFILLED_SYRINGE | INTRAMUSCULAR | Status: DC
  Administered 2021-06-24 – 2021-06-29 (×6): 40 mg via SUBCUTANEOUS
  Filled 2021-06-23 (×7): qty 0.4

## 2021-06-23 MED ORDER — SACCHAROMYCES BOULARDII 250 MG PO CAPS
250.0000 mg | ORAL_CAPSULE | Freq: Two times a day (BID) | ORAL | Status: DC
  Administered 2021-06-23 – 2021-06-27 (×9): 250 mg via ORAL
  Filled 2021-06-23 (×9): qty 1

## 2021-06-23 MED ORDER — BUPIVACAINE LIPOSOME 1.3 % IJ SUSP
INTRAMUSCULAR | Status: DC | PRN
  Administered 2021-06-23: 20 mL

## 2021-06-23 MED ORDER — DIPHENHYDRAMINE HCL 25 MG PO CAPS: 25.0000 mg | ORAL_CAPSULE | Freq: Four times a day (QID) | ORAL | Status: DC | PRN

## 2021-06-23 MED ORDER — FLUOXETINE HCL 20 MG PO CAPS
20.0000 mg | ORAL_CAPSULE | Freq: Every day | ORAL | Status: DC
  Administered 2021-06-23 – 2021-06-29 (×7): 20 mg via ORAL
  Filled 2021-06-23 (×7): qty 1

## 2021-06-23 MED ORDER — EPHEDRINE 5 MG/ML INJ
INTRAVENOUS | Status: AC
  Filled 2021-06-23: qty 20

## 2021-06-23 MED ORDER — HYDROMORPHONE HCL 2 MG/ML IJ SOLN
INTRAMUSCULAR | Status: AC
  Filled 2021-06-23: qty 1

## 2021-06-23 MED ORDER — CHLORHEXIDINE GLUCONATE CLOTH 2 % EX PADS
6.0000 | MEDICATED_PAD | Freq: Every day | CUTANEOUS | Status: DC
  Administered 2021-06-24: 6 via TOPICAL

## 2021-06-23 MED ORDER — PHENYLEPHRINE 40 MCG/ML (10ML) SYRINGE FOR IV PUSH (FOR BLOOD PRESSURE SUPPORT)
PREFILLED_SYRINGE | INTRAVENOUS | Status: DC | PRN
  Administered 2021-06-23: 300 ug via INTRAVENOUS

## 2021-06-23 MED ORDER — ALBUMIN HUMAN 5 % IV SOLN
INTRAVENOUS | Status: AC
  Filled 2021-06-23: qty 250

## 2021-06-23 MED ORDER — 0.9 % SODIUM CHLORIDE (POUR BTL) OPTIME
TOPICAL | Status: DC | PRN
  Administered 2021-06-23: 2000 mL

## 2021-06-23 MED ORDER — OXYCODONE HCL 5 MG PO TABS: 5.0000 mg | ORAL_TABLET | Freq: Once | ORAL | Status: DC | PRN

## 2021-06-23 MED ORDER — ROCURONIUM BROMIDE 10 MG/ML (PF) SYRINGE
PREFILLED_SYRINGE | INTRAVENOUS | Status: AC
  Filled 2021-06-23: qty 10

## 2021-06-23 MED ORDER — GABAPENTIN 300 MG PO CAPS
300.0000 mg | ORAL_CAPSULE | Freq: Two times a day (BID) | ORAL | Status: DC
  Administered 2021-06-23 – 2021-06-30 (×14): 300 mg via ORAL
  Filled 2021-06-23 (×14): qty 1

## 2021-06-23 MED ORDER — ACETAMINOPHEN 500 MG PO TABS
1000.0000 mg | ORAL_TABLET | Freq: Four times a day (QID) | ORAL | Status: DC
  Administered 2021-06-23 – 2021-06-29 (×23): 1000 mg via ORAL
  Filled 2021-06-23 (×25): qty 2

## 2021-06-23 MED ORDER — LACTATED RINGERS IR SOLN
Status: DC | PRN
  Administered 2021-06-23: 2000 mL

## 2021-06-23 MED ORDER — ALVIMOPAN 12 MG PO CAPS
12.0000 mg | ORAL_CAPSULE | Freq: Two times a day (BID) | ORAL | Status: DC
  Administered 2021-06-24 (×2): 12 mg via ORAL
  Filled 2021-06-23 (×2): qty 1

## 2021-06-23 MED ORDER — HYDROMORPHONE HCL 1 MG/ML IJ SOLN
INTRAMUSCULAR | Status: DC | PRN
  Administered 2021-06-23: 1 mg via INTRAVENOUS

## 2021-06-23 MED ORDER — ONDANSETRON HCL 4 MG/2ML IJ SOLN
INTRAMUSCULAR | Status: DC | PRN
  Administered 2021-06-23: 4 mg via INTRAVENOUS

## 2021-06-23 MED ORDER — LIDOCAINE 2% (20 MG/ML) 5 ML SYRINGE
INTRAMUSCULAR | Status: DC | PRN
  Administered 2021-06-23: 60 mg via INTRAVENOUS

## 2021-06-23 MED ORDER — AMISULPRIDE (ANTIEMETIC) 5 MG/2ML IV SOLN: 10.0000 mg | Freq: Once | INTRAVENOUS | Status: DC | PRN

## 2021-06-23 MED ORDER — SPY AGENT GREEN - (INDOCYANINE FOR INJECTION)
INTRAMUSCULAR | Status: DC | PRN
  Administered 2021-06-23 (×2): 3 mL via INTRAVENOUS

## 2021-06-23 MED ORDER — PROPOFOL 10 MG/ML IV BOLUS
INTRAVENOUS | Status: DC | PRN
  Administered 2021-06-23: 200 mg via INTRAVENOUS

## 2021-06-23 MED ORDER — DEXTROSE 5 % IV SOLN
2.0000 g | INTRAVENOUS | Status: DC
  Filled 2021-06-23: qty 2

## 2021-06-23 MED ORDER — METHYLENE BLUE 0.5 % INJ SOLN
INTRAVENOUS | Status: AC
  Filled 2021-06-23: qty 10

## 2021-06-23 MED ORDER — SODIUM CHLORIDE 0.9 % IV SOLN
2.0000 g | Freq: Two times a day (BID) | INTRAVENOUS | Status: AC
  Administered 2021-06-24: 2 g via INTRAVENOUS
  Filled 2021-06-23: qty 2

## 2021-06-23 MED ORDER — ONDANSETRON HCL 4 MG PO TABS
4.0000 mg | ORAL_TABLET | Freq: Four times a day (QID) | ORAL | Status: DC | PRN
  Administered 2021-06-24: 4 mg via ORAL
  Filled 2021-06-23: qty 1

## 2021-06-23 MED ORDER — ENSURE PRE-SURGERY PO LIQD
592.0000 mL | Freq: Once | ORAL | Status: DC
  Filled 2021-06-23: qty 592

## 2021-06-23 MED ORDER — KETAMINE HCL 10 MG/ML IJ SOLN
INTRAMUSCULAR | Status: DC | PRN
  Administered 2021-06-23: 10 mg via INTRAVENOUS
  Administered 2021-06-23: 40 mg via INTRAVENOUS
  Administered 2021-06-23: 20 mg via INTRAVENOUS
  Administered 2021-06-23 (×2): 10 mg via INTRAVENOUS

## 2021-06-23 MED ORDER — KETAMINE HCL 10 MG/ML IJ SOLN
INTRAMUSCULAR | Status: AC
  Filled 2021-06-23: qty 1

## 2021-06-23 MED ORDER — OXYCODONE HCL 5 MG/5ML PO SOLN: 5.0000 mg | Freq: Once | ORAL | Status: DC | PRN

## 2021-06-23 MED ORDER — LACTATED RINGERS IV SOLN: INTRAVENOUS | Status: DC

## 2021-06-23 MED ORDER — TRAMADOL HCL 50 MG PO TABS
50.0000 mg | ORAL_TABLET | Freq: Four times a day (QID) | ORAL | Status: DC | PRN
  Administered 2021-06-23: 50 mg via ORAL
  Administered 2021-06-24 – 2021-06-26 (×3): 100 mg via ORAL
  Administered 2021-06-27: 50 mg via ORAL
  Filled 2021-06-23 (×3): qty 1
  Filled 2021-06-23 (×2): qty 2

## 2021-06-23 MED ORDER — PROPOFOL 10 MG/ML IV BOLUS
INTRAVENOUS | Status: AC
  Filled 2021-06-23: qty 20

## 2021-06-23 MED ORDER — CHLORHEXIDINE GLUCONATE 0.12 % MT SOLN
15.0000 mL | Freq: Once | OROMUCOSAL | Status: AC
  Administered 2021-06-23: 15 mL via OROMUCOSAL

## 2021-06-23 MED ORDER — HYDROMORPHONE HCL 1 MG/ML IJ SOLN
0.5000 mg | INTRAMUSCULAR | Status: DC | PRN
  Administered 2021-06-23 – 2021-06-24 (×3): 0.5 mg via INTRAVENOUS
  Filled 2021-06-23 (×3): qty 0.5

## 2021-06-23 MED ORDER — LIDOCAINE 2% (20 MG/ML) 5 ML SYRINGE
INTRAMUSCULAR | Status: AC
  Filled 2021-06-23: qty 5

## 2021-06-23 MED ORDER — ALBUMIN HUMAN 5 % IV SOLN: INTRAVENOUS | Status: DC | PRN

## 2021-06-23 MED ORDER — FENTANYL CITRATE (PF) 100 MCG/2ML IJ SOLN
25.0000 ug | INTRAMUSCULAR | Status: DC | PRN
  Administered 2021-06-23: 50 ug via INTRAVENOUS

## 2021-06-23 MED ORDER — FENTANYL CITRATE (PF) 100 MCG/2ML IJ SOLN
INTRAMUSCULAR | Status: DC | PRN
  Administered 2021-06-23: 100 ug via INTRAVENOUS
  Administered 2021-06-23: 50 ug via INTRAVENOUS
  Administered 2021-06-23: 100 ug via INTRAVENOUS
  Administered 2021-06-23 (×2): 50 ug via INTRAVENOUS
  Administered 2021-06-23: 100 ug via INTRAVENOUS
  Administered 2021-06-23: 50 ug via INTRAVENOUS
  Administered 2021-06-23: 100 ug via INTRAVENOUS

## 2021-06-23 MED ORDER — FENTANYL CITRATE (PF) 100 MCG/2ML IJ SOLN
INTRAMUSCULAR | Status: AC
  Filled 2021-06-23: qty 4

## 2021-06-23 MED ORDER — LIDOCAINE HCL (PF) 2 % IJ SOLN
INTRAMUSCULAR | Status: DC | PRN
  Administered 2021-06-23: 1.5 mg/kg/h via INTRADERMAL

## 2021-06-23 MED ORDER — BUPIVACAINE-EPINEPHRINE 0.25% -1:200000 IJ SOLN
INTRAMUSCULAR | Status: DC | PRN
  Administered 2021-06-23: 30 mL

## 2021-06-23 MED ORDER — ALUM & MAG HYDROXIDE-SIMETH 200-200-20 MG/5ML PO SUSP
30.0000 mL | Freq: Four times a day (QID) | ORAL | Status: DC | PRN
  Filled 2021-06-23: qty 30

## 2021-06-23 MED ORDER — LACTATED RINGERS IV SOLN: INTRAVENOUS | Status: DC | PRN

## 2021-06-23 MED ORDER — HEPARIN SODIUM (PORCINE) 5000 UNIT/ML IJ SOLN
5000.0000 [IU] | Freq: Once | INTRAMUSCULAR | Status: AC
  Administered 2021-06-23: 5000 [IU] via SUBCUTANEOUS
  Filled 2021-06-23: qty 1

## 2021-06-23 MED ORDER — ONDANSETRON HCL 4 MG/2ML IJ SOLN
INTRAMUSCULAR | Status: AC
  Filled 2021-06-23: qty 2

## 2021-06-23 MED ORDER — ONDANSETRON HCL 4 MG/2ML IJ SOLN: 4.0000 mg | Freq: Four times a day (QID) | INTRAMUSCULAR | Status: DC | PRN

## 2021-06-23 MED ORDER — EPHEDRINE SULFATE-NACL 50-0.9 MG/10ML-% IV SOSY
PREFILLED_SYRINGE | INTRAVENOUS | Status: DC | PRN
  Administered 2021-06-23 (×2): 10 mg via INTRAVENOUS

## 2021-06-23 MED ORDER — ACETAMINOPHEN 500 MG PO TABS
1000.0000 mg | ORAL_TABLET | ORAL | Status: AC
  Administered 2021-06-23: 1000 mg via ORAL
  Filled 2021-06-23: qty 2

## 2021-06-23 MED ORDER — ONDANSETRON HCL 4 MG/2ML IJ SOLN: 4.0000 mg | Freq: Once | INTRAMUSCULAR | Status: DC | PRN

## 2021-06-23 MED ORDER — KCL IN DEXTROSE-NACL 20-5-0.45 MEQ/L-%-% IV SOLN
INTRAVENOUS | Status: DC
  Filled 2021-06-23 (×2): qty 1000

## 2021-06-23 MED ORDER — BUPIVACAINE-EPINEPHRINE (PF) 0.25% -1:200000 IJ SOLN
INTRAMUSCULAR | Status: AC
  Filled 2021-06-23: qty 30

## 2021-06-23 MED ORDER — METOPROLOL TARTRATE 5 MG/5ML IV SOLN: 5.0000 mg | Freq: Four times a day (QID) | INTRAVENOUS | Status: DC | PRN

## 2021-06-23 SURGICAL SUPPLY — 95 items
BAG COUNTER SPONGE SURGICOUNT (BAG) ×3 IMPLANT
BLADE EXTENDED COATED 6.5IN (ELECTRODE) IMPLANT
CANNULA REDUC XI 12-8 STAPL (CANNULA) ×1
CANNULA REDUCER 12-8 DVNC XI (CANNULA) ×2 IMPLANT
CELLS DAT CNTRL 66122 CELL SVR (MISCELLANEOUS) IMPLANT
COVER SURGICAL LIGHT HANDLE (MISCELLANEOUS) ×6 IMPLANT
COVER TIP SHEARS 8 DVNC (MISCELLANEOUS) ×2 IMPLANT
COVER TIP SHEARS 8MM DA VINCI (MISCELLANEOUS) ×1
DECANTER SPIKE VIAL GLASS SM (MISCELLANEOUS) IMPLANT
DRAIN CHANNEL 19F RND (DRAIN) IMPLANT
DRAPE ARM DVNC X/XI (DISPOSABLE) ×8 IMPLANT
DRAPE COLUMN DVNC XI (DISPOSABLE) ×2 IMPLANT
DRAPE DA VINCI XI ARM (DISPOSABLE) ×4
DRAPE DA VINCI XI COLUMN (DISPOSABLE) ×1
DRAPE SURG IRRIG POUCH 19X23 (DRAPES) ×3 IMPLANT
DRSG OPSITE POSTOP 4X10 (GAUZE/BANDAGES/DRESSINGS) IMPLANT
DRSG OPSITE POSTOP 4X6 (GAUZE/BANDAGES/DRESSINGS) IMPLANT
DRSG OPSITE POSTOP 4X8 (GAUZE/BANDAGES/DRESSINGS) IMPLANT
ELECT PENCIL ROCKER SW 15FT (MISCELLANEOUS) ×3 IMPLANT
ELECT REM PT RETURN 15FT ADLT (MISCELLANEOUS) ×3 IMPLANT
ENDOLOOP SUT PDS II  0 18 (SUTURE)
ENDOLOOP SUT PDS II 0 18 (SUTURE) IMPLANT
EVACUATOR SILICONE 100CC (DRAIN) IMPLANT
GLOVE SURG ENC MOIS LTX SZ6.5 (GLOVE) ×9 IMPLANT
GLOVE SURG UNDER POLY LF SZ7 (GLOVE) ×6 IMPLANT
GOWN STRL REUS W/TWL XL LVL3 (GOWN DISPOSABLE) ×9 IMPLANT
GRASPER SUT TROCAR 14GX15 (MISCELLANEOUS) IMPLANT
HOLDER FOLEY CATH W/STRAP (MISCELLANEOUS) ×3 IMPLANT
IRRIG SUCT STRYKERFLOW 2 WTIP (MISCELLANEOUS) ×3
IRRIGATION SUCT STRKRFLW 2 WTP (MISCELLANEOUS) ×2 IMPLANT
IRRIGATOR SUCT 8 DISP DVNC XI (IRRIGATION / IRRIGATOR) ×2 IMPLANT
IRRIGATOR SUCTION 8MM XI DISP (IRRIGATION / IRRIGATOR) ×1
KIT PROCEDURE DA VINCI SI (MISCELLANEOUS)
KIT PROCEDURE DVNC SI (MISCELLANEOUS) IMPLANT
KIT TURNOVER KIT A (KITS) ×3 IMPLANT
NEEDLE INSUFFLATION 14GA 120MM (NEEDLE) ×3 IMPLANT
PACK CARDIOVASCULAR III (CUSTOM PROCEDURE TRAY) ×3 IMPLANT
PACK COLON (CUSTOM PROCEDURE TRAY) ×3 IMPLANT
PAD POSITIONING PINK XL (MISCELLANEOUS) ×3 IMPLANT
PORT LAP GEL ALEXIS MED 5-9CM (MISCELLANEOUS) IMPLANT
RELOAD PROXIMATE 75MM BLUE (ENDOMECHANICALS) ×18 IMPLANT
RELOAD STAPLER 3.5X60 BLU DVNC (STAPLE) IMPLANT
RELOAD STAPLER 4.3X45 GRN DVNC (STAPLE) ×4 IMPLANT
RELOAD STAPLER 4.3X60 GRN DVNC (STAPLE) IMPLANT
RTRCTR WOUND ALEXIS 18CM MED (MISCELLANEOUS)
SCISSORS LAP 5X35 DISP (ENDOMECHANICALS) IMPLANT
SEAL CANN UNIV 5-8 DVNC XI (MISCELLANEOUS) ×6 IMPLANT
SEAL XI 5MM-8MM UNIVERSAL (MISCELLANEOUS) ×3
SEALER VESSEL DA VINCI XI (MISCELLANEOUS) ×1
SEALER VESSEL EXT DVNC XI (MISCELLANEOUS) ×2 IMPLANT
SOLUTION ELECTROLUBE (MISCELLANEOUS) ×3 IMPLANT
SPONGE T-LAP 18X18 ~~LOC~~+RFID (SPONGE) ×3 IMPLANT
STAPLER 45 DA VINCI SURE FORM (STAPLE) ×1
STAPLER 45 SUREFORM DVNC (STAPLE) ×2 IMPLANT
STAPLER 60 DA VINCI SURE FORM (STAPLE)
STAPLER 60 SUREFORM DVNC (STAPLE) IMPLANT
STAPLER CANNULA SEAL DVNC XI (STAPLE) IMPLANT
STAPLER CANNULA SEAL XI (STAPLE)
STAPLER ECHELON POWER CIR 29 (STAPLE) IMPLANT
STAPLER ECHELON POWER CIR 31 (STAPLE) IMPLANT
STAPLER PROXIMATE 75MM BLUE (STAPLE) ×3 IMPLANT
STAPLER RELOAD 3.5X60 BLU DVNC (STAPLE)
STAPLER RELOAD 3.5X60 BLUE (STAPLE)
STAPLER RELOAD 4.3X45 GREEN (STAPLE) ×2
STAPLER RELOAD 4.3X45 GRN DVNC (STAPLE) ×4
STAPLER RELOAD 4.3X60 GREEN (STAPLE)
STAPLER RELOAD 4.3X60 GRN DVNC (STAPLE)
STOPCOCK 4 WAY LG BORE MALE ST (IV SETS) ×6 IMPLANT
SUT ETHILON 2 0 PS N (SUTURE) IMPLANT
SUT NOVA NAB DX-16 0-1 5-0 T12 (SUTURE) ×6 IMPLANT
SUT PROLENE 2 0 KS (SUTURE) ×3 IMPLANT
SUT PROLENE 2 0 SH DA (SUTURE) ×9 IMPLANT
SUT SILK 2 0 (SUTURE) ×1
SUT SILK 2 0 SH CR/8 (SUTURE) IMPLANT
SUT SILK 2-0 18XBRD TIE 12 (SUTURE) ×2 IMPLANT
SUT SILK 3 0 (SUTURE)
SUT SILK 3 0 SH CR/8 (SUTURE) ×3 IMPLANT
SUT SILK 3-0 18XBRD TIE 12 (SUTURE) IMPLANT
SUT V-LOC BARB 180 2/0GR6 GS22 (SUTURE)
SUT VIC AB 2-0 SH 18 (SUTURE) IMPLANT
SUT VIC AB 2-0 SH 27 (SUTURE) ×1
SUT VIC AB 2-0 SH 27X BRD (SUTURE) ×2 IMPLANT
SUT VIC AB 3-0 SH 18 (SUTURE) IMPLANT
SUT VIC AB 4-0 PS2 27 (SUTURE) ×6 IMPLANT
SUT VICRYL 0 UR6 27IN ABS (SUTURE) ×6 IMPLANT
SUTURE V-LC BRB 180 2/0GR6GS22 (SUTURE) IMPLANT
SYR 10ML ECCENTRIC (SYRINGE) ×3 IMPLANT
SYS LAPSCP GELPORT 120MM (MISCELLANEOUS)
SYSTEM LAPSCP GELPORT 120MM (MISCELLANEOUS) IMPLANT
TOWEL OR 17X26 10 PK STRL BLUE (TOWEL DISPOSABLE) IMPLANT
TOWEL OR NON WOVEN STRL DISP B (DISPOSABLE) ×3 IMPLANT
TRAY FOLEY MTR SLVR 16FR STAT (SET/KITS/TRAYS/PACK) ×3 IMPLANT
TROCAR ADV FIXATION 5X100MM (TROCAR) ×3 IMPLANT
TUBING CONNECTING 10 (TUBING) ×6 IMPLANT
TUBING INSUFFLATION 10FT LAP (TUBING) ×3 IMPLANT

## 2021-06-23 NOTE — Anesthesia Preprocedure Evaluation (Addendum)
Anesthesia Evaluation  Patient identified by MRN, date of birth, ID band Patient awake    Reviewed: Allergy & Precautions, NPO status , Patient's Chart, lab work & pertinent test results  History of Anesthesia Complications Negative for: history of anesthetic complications  Airway Mallampati: I  TM Distance: >3 FB Neck ROM: Full  Mouth opening: Limited Mouth Opening  Dental  (+) Teeth Intact   Pulmonary neg pulmonary ROS, former smoker,    Pulmonary exam normal        Cardiovascular negative cardio ROS Normal cardiovascular exam     Neuro/Psych Anxiety Depression negative neurological ROS     GI/Hepatic Neg liver ROS, UC   Endo/Other  negative endocrine ROS  Renal/GU negative Renal ROS  negative genitourinary   Musculoskeletal negative musculoskeletal ROS (+)   Abdominal   Peds  Hematology negative hematology ROS (+)   Anesthesia Other Findings   Reproductive/Obstetrics                           Anesthesia Physical Anesthesia Plan  ASA: 2  Anesthesia Plan: General   Post-op Pain Management:    Induction: Intravenous  PONV Risk Score and Plan: 3 and Ondansetron, Dexamethasone, Treatment may vary due to age or medical condition and Midazolam  Airway Management Planned: Oral ETT  Additional Equipment: None  Intra-op Plan:   Post-operative Plan: Extubation in OR  Informed Consent: I have reviewed the patients History and Physical, chart, labs and discussed the procedure including the risks, benefits and alternatives for the proposed anesthesia with the patient or authorized representative who has indicated his/her understanding and acceptance.     Dental advisory given  Plan Discussed with:   Anesthesia Plan Comments:         Anesthesia Quick Evaluation

## 2021-06-23 NOTE — Anesthesia Postprocedure Evaluation (Signed)
Anesthesia Post Note  Patient: Brian Blackburn  Procedure(s) Performed: XI ROBOTIC ASSISTED PROCTECTOMY WITH J POUCH CREATION AND LOOP ILEOSTOMY (Abdomen) LAPAROSCOPIC LYSIS OF ADHESIONS     Patient location during evaluation: PACU Anesthesia Type: General Level of consciousness: awake and alert Pain management: pain level controlled Vital Signs Assessment: post-procedure vital signs reviewed and stable Respiratory status: spontaneous breathing, nonlabored ventilation and respiratory function stable Cardiovascular status: blood pressure returned to baseline and stable Postop Assessment: no apparent nausea or vomiting Anesthetic complications: no   No notable events documented.  Last Vitals:  Vitals:   06/23/21 1430 06/23/21 1445  BP: (!) 159/97   Pulse: 95   Resp: 16   Temp:  37.3 C  SpO2: 96%     Last Pain:  Vitals:   06/23/21 1430  TempSrc:   PainSc: Asleep                 Lucretia Kern

## 2021-06-23 NOTE — H&P (Signed)
The patient is a 45 year old male presenting for a post-operative concern. 45 year old male who presents to the office today for evaluation of ileostomy reversal.  He has a history of Crohn's disease diagnosed in 2007.  His colonoscopy in 2018 showed pancolitis.  He has been managed on 6 MP, steroids and Humira.  He did well on Humira.  Unfortunately, he developed a perforation of his colon during surveillance colonoscopy in July 2021.  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  He was hospitalized for approximately 9 days and then rehospitalized approximately 1 week later with an intra-abdominal abscess requiring IV antibiotics and CT-guided drain with another 5 day hospital course.  Since that time he has been recovering well.  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  He is not on any treatment for his ulcerative colitis currently.  We performed a flexible proctoscopy which showed some mild inflammation of the rectum.  Biopsies are consistent with ulcerative colitis without dysplasia or granulomas.       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 Medications Reconciled    Social History  Alcohol use  Occasional alcohol use. Caffeine use  Coffee. No drug use  Tobacco use  Former smoker.   Family History  First Degree Relatives  No pertinent family history    Other Problems Crohn's Disease  Ulcerative Colitis          Review of Systems  General Not Present- Appetite Loss, Chills, Fatigue, Fever, Night Sweats, Weight Gain and Weight Loss. Skin Not Present- Change in Wart/Mole, Dryness,  Hives, Jaundice, New Lesions, Non-Healing Wounds, Rash and Ulcer. HEENT Not Present- Earache, Hearing Loss, Hoarseness, Nose Bleed, Oral Ulcers, Ringing in the Ears, Seasonal Allergies, Sinus Pain, Sore Throat, Visual Disturbances, Wears glasses/contact lenses and Yellow Eyes. Respiratory Not Present- Bloody sputum, Chronic Cough, Difficulty Breathing, Snoring and Wheezing. Breast Not Present- Breast Mass, Breast Pain, Nipple Discharge and Skin Changes. Cardiovascular Not Present- Chest Pain, Difficulty Breathing Lying Down, Leg Cramps, Palpitations, Rapid Heart Rate, Shortness of Breath and Swelling of Extremities. Gastrointestinal Not Present- Abdominal Pain, Bloating, Bloody Stool, Change in Bowel Habits, Chronic diarrhea, Constipation, Difficulty Swallowing, Excessive gas, Gets full quickly at meals, Hemorrhoids, Indigestion, Nausea, Rectal Pain and Vomiting. Male Genitourinary Not Present- Blood in Urine, Change in Urinary Stream, Frequency, Impotence, Nocturia, Painful Urination, Urgency and Urine Leakage.     Physical Exam   General Mental Status - Alert. General Appearance - Cooperative.  CV: RRR Lungs: CTA Abdomen Inspection Skin - Skin - Scar - Note: Large abdominal scar throughout the entire abdomen, well-healed. Ileostomy - Right Lower Quadrant. Palpation/Percussion Palpation and Percussion of the abdomen reveal - Soft and Non Tender.     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 Flexible proctoscopy shows continued active inflammation consistent with ulcerative colitis on biopsy. I have recommended proctectomy and J-pouch. We have discussed this in detail including risk of pouch failure and small risk of Crohn's disease. We have discussed typical side effects of rectal surgery including difficulty with urinary and  sexual function. We have  discussed bowel habits after surgery and typical recovery times. We discussed the need for diverting loop ileostomy and a second surgery for reversal, once the pouch heals. The surgery and anatomy were described to the patient as well as the risks of surgery and the possible complications. These include: Bleeding, deep abdominal infections and possible wound complications such as hernia and infection, damage to adjacent structures, leak of surgical connections, which can lead to other surgeries and possibly an ostomy, possible need for other procedures, such as abscess drains in radiology, possible prolonged hospital stay, possible diarrhea from removal of part of the colon, possible constipation from narcotics, possible bowel, bladder or sexual dysfunction if having rectal surgery, prolonged fatigue/weakness or appetite loss, possible early recurrence of of disease, possible complications of their medical problems such as heart disease or arrhythmias or lung problems, death (less than 1%). I believe the patient understands and wishes to proceed with the surgery.

## 2021-06-23 NOTE — Anesthesia Procedure Notes (Signed)
Procedure Name: Intubation Date/Time: 06/23/2021 7:31 AM Performed by: Vanessa Menno, CRNA Pre-anesthesia Checklist: Patient identified, Emergency Drugs available, Suction available and Patient being monitored Patient Re-evaluated:Patient Re-evaluated prior to induction Oxygen Delivery Method: Circle system utilized Preoxygenation: Pre-oxygenation with 100% oxygen Induction Type: IV induction Ventilation: Mask ventilation without difficulty and Oral airway inserted - appropriate to patient size Laryngoscope Size: 2 and Miller Grade View: Grade II Tube type: Oral Tube size: 7.5 mm Number of attempts: 1 Airway Equipment and Method: Stylet Placement Confirmation: ETT inserted through vocal cords under direct vision, positive ETCO2 and breath sounds checked- equal and bilateral Secured at: 22 cm Tube secured with: Tape Dental Injury: Teeth and Oropharynx as per pre-operative assessment

## 2021-06-23 NOTE — Op Note (Signed)
06/23/2021  1:15 PM  PATIENT:  Brian Blackburn  45 y.o. male  Patient Care Team: Maudie Flakes, FNP as PCP - General (Family Medicine)  PRE-OPERATIVE DIAGNOSIS:  ULCERATIVE COLITIS  POST-OPERATIVE DIAGNOSIS:  ULCERATIVE COLITIS  PROCEDURE:  XI ROBOTIC ASSISTED PROCTECTOMY WITH J POUCH CREATION AND LOOP ILEOSTOMY ROBOTIC LYSIS OF ADHESIONS INTRAOPERATIVE ASSESSMENT OF VASCULAR PERFUSION   Surgeon(s): Romie Levee, MD Karie Soda, MD  ASSISTANT: Dr Michaell Cowing   ANESTHESIA:   local and general  EBL: Total I/O In: 2100 [I.V.:2000; IV Piggyback:100] Out: 700 [Urine:300; Blood:400]  Delay start of Pharmacological VTE agent (>24hrs) due to surgical blood loss or risk of bleeding:  no  DRAINS: (34F) Jackson-Pratt drain(s) with closed bulb suction in the pelvis    SPECIMEN:  Source of Specimen:  rectum, terminal ileum and ileostomy  DISPOSITION OF SPECIMEN:  PATHOLOGY  COUNTS:  YES  PLAN OF CARE: Admit to inpatient   PATIENT DISPOSITION:  PACU - hemodynamically stable.  INDICATION:    45 year old male with ulcerative colitis status post emergent total abdominal colectomy due to perforation during colonoscopy.  He presented to my office for a second opinion.  Given that he has ulcerative colitis that involve the rectum, I recommended proctectomy and J-pouch creation.  We discussed the risk and benefits of this in detail.  All questions were answered.  The patient expressed understanding & wished to proceed with surgery.  OR FINDINGS:   Patient had significant filmy adhesions throughout his small bowel.  DESCRIPTION:   Informed consent was confirmed.  The patient underwent general anaesthesia without difficulty.  The patient was positioned appropriately.  VTE prevention in place.  The patient's abdomen was clipped, prepped, & draped in a sterile fashion.  Surgical timeout confirmed our plan.  The patient was positioned in reverse Trendelenburg.  Abdominal entry was  gained using a varies needle in the left upper quadrant.  Entry was clean.  I induced carbon dioxide insufflation.  An 92mm robotic port was placed in the left lower quadrant.  Camera inspection revealed no injury.  I then divided the mucocutaneous junction of the ileostomy using electrocautery.  Dissection was carried down through the subcutaneous tissues freeing the ileostomy from its adhesions.  I entered into the peritoneum and bluntly swept these adhesions off the abdominal wall.  I continued my dissection around the ileostomy until this was completely free.  This was stapled off using a GIA blue load 75 mm stapler.  The end of the terminal ileum was dropped into the abdomen.  An Alexis wound protector was placed at the ileostomy site and the cap was placed over this.  The abdomen was reinsufflated.  Extra ports were carefully placed under direct laparoscopic visualization.  The omental adhesions to abdominal wall were taken down using mostly blunt dissection.  There was a loop of small bowel that was adherent in the left upper quadrant.  This was taken down using laparoscopic scissors.  The patient was appropriately positioned and the robot was docked to the patient's left side.  Instruments were placed under direct visualization.   I began by lysing all the adhesions of the small bowel to allow for mobilization of the terminal ileum.  This was done using robotic scissors and blunt dissection.  I mobilized the entire mesentery of the small bowel as well up to the level of the root of the mesentery at the duodenum.  Once this was free, I began my proctectomy.  The anterior abdominal adhesions were divided  using the robotic vessel sealer.  I then continued my plane around the lateral edges.  Identified the mesentery of the rectum.  I bluntly entered into the presacral plane.  I then mobilized laterally around the pelvic brim on both sides.  I divided the mesentery after confirming that the ureters were out of  the way.  I divided this down to the level of the previously dissected presacral plane using the robotic vessel sealer.  I then continued my dissection posteriorly in the presacral plane.  I dissected out laterally preserving the edge of the mesentery to avoid nerve injury.  I continued down until I reached the pelvic floor.  I dissected anteriorly dividing the peritoneal reflection using the robotic vessel sealer.  I continued my dissection down the rectum to the level of the pelvic floor anteriorly.  I confirmed that we were at the top of the anal canal using digital inspection.  Once we were free circumferentially I divided the anorectal junction using 2 green load robotic 45 mm staplers.  This freed the entire rectum.  Hemostasis was good in the pelvis.  At this point the robot was undocked.  We brought out the terminal ileum through the [facility] wound protector.  A 15 cm J-pouch was created using the terminal ileum.  This was done using 2, 75 mm GIA stapler loads.  It did not feel that the pouch would reach to the anal rectal junction.  We decided to attempt to divide the mesentery on the medial side of the ileum.  Once this was completed, a small enterotomy was made in the end of the J-pouch.  A 2-0 Prolene pursestring suture was placed.  A 25 mm EEA anvil was inserted and brought out through the pursestring.  This was then tied tightly around the anvil.  This was placed back into the abdomen.  It did not appear that the J-pouch would reach into the pelvis.  Upon stretching it the distal limb of the J-pouch appeared hypoperfused.  We checked this with intravascular firefly injection.  There was decreased perfusion in the distal limb of the J-pouch consistent with ischemia.  The abdomen was desufflated.  The J-pouch was removed.  The anvil was removed as well.  I stapled off the J-pouch using a GIA blue load 75 mm stapler.  I then created a new J-pouch using another 2 loads of the GIA blue load stapler.  A 2-0  Prolene pursestring suture was then placed at the end of the J-pouch.  A 25 mm anvil was then placed back into the terminal ileum and the pursestring was tied tightly around this.  The staple line of the distal portion of the limb was oversewn using 3-0 silk sutures.  An antitension suture was also placed using a 3-0 silk suture.  The J-pouch was then placed back into the abdomen.  This appeared to reach into the pelvis without difficulty.  The EEA stapler was brought out through the anal rectal stump and the J-pouch was connected without difficulty.  There was minimal tension on the posterior portion of the J-pouch only.  I evaluated this using a proctoscope.  The mucosa appeared well perfused.  There was no active bleeding from the staple line.  There was no leak when tested with insufflation.  We then brought out a portion of proximal ileum to create a loop ileostomy.  This was done over a red rubber catheter to act as an ostomy bar.  After confirming proximal and distal  limbs of the loop ileostomy the abdomen was desufflated and the ports were removed.  The port sites were closed using interrupted 4-0 Vicryl sutures and Dermabond.  The ostomy was matured in standard [place] fashion using interrupted 2-0 Vicryl sutures.  An ostomy appliance was then placed.  A 2-0 Prolene suture was used to secure the drain to the skin as well.  The patient was then awakened from anesthesia and sent to the postanesthesia care unit in stable condition.  All counts were correct per operating room staff.  An MD assistant was necessary for tissue manipulation, retraction and positioning due to the complexity of the case and hospital policies

## 2021-06-23 NOTE — Transfer of Care (Signed)
Immediate Anesthesia Transfer of Care Note  Patient: Brian Blackburn  Procedure(s) Performed: XI ROBOTIC ASSISTED PROCTECTOMY WITH J POUCH CREATION AND LOOP ILEOSTOMY (Abdomen) LAPAROSCOPIC LYSIS OF ADHESIONS  Patient Location: PACU  Anesthesia Type:General  Level of Consciousness: awake, alert , oriented and patient cooperative  Airway & Oxygen Therapy: Patient Spontanous Breathing and Patient connected to face mask  Post-op Assessment: Report given to RN and Post -op Vital signs reviewed and stable  Post vital signs: Reviewed and stable  Last Vitals:  Vitals Value Taken Time  BP 165/104 06/23/21 1337  Temp    Pulse 108 06/23/21 1342  Resp 10 06/23/21 1342  SpO2 99 % 06/23/21 1342  Vitals shown include unvalidated device data.  Last Pain:  Vitals:   06/23/21 0640  TempSrc: Oral         Complications: No notable events documented.

## 2021-06-24 ENCOUNTER — Encounter (HOSPITAL_COMMUNITY): Payer: Self-pay | Admitting: General Surgery

## 2021-06-24 LAB — CBC
HCT: 40.3 % (ref 39.0–52.0)
Hemoglobin: 13.5 g/dL (ref 13.0–17.0)
MCH: 31.5 pg (ref 26.0–34.0)
MCHC: 33.5 g/dL (ref 30.0–36.0)
MCV: 93.9 fL (ref 80.0–100.0)
Platelets: 207 10*3/uL (ref 150–400)
RBC: 4.29 MIL/uL (ref 4.22–5.81)
RDW: 13.7 % (ref 11.5–15.5)
WBC: 15.4 10*3/uL — ABNORMAL HIGH (ref 4.0–10.5)
nRBC: 0 % (ref 0.0–0.2)

## 2021-06-24 LAB — BASIC METABOLIC PANEL
Anion gap: 8 (ref 5–15)
BUN: 11 mg/dL (ref 6–20)
CO2: 28 mmol/L (ref 22–32)
Calcium: 8.4 mg/dL — ABNORMAL LOW (ref 8.9–10.3)
Chloride: 103 mmol/L (ref 98–111)
Creatinine, Ser: 0.92 mg/dL (ref 0.61–1.24)
GFR, Estimated: >60 mL/min (ref >60)
Glucose, Bld: 149 mg/dL — ABNORMAL HIGH (ref 70–99)
Potassium: 3.7 mmol/L (ref 3.5–5.1)
Sodium: 139 mmol/L (ref 135–145)

## 2021-06-24 MED ORDER — METHOCARBAMOL 1000 MG/10ML IJ SOLN
500.0000 mg | Freq: Four times a day (QID) | INTRAVENOUS | Status: DC
  Administered 2021-06-24 – 2021-06-28 (×15): 500 mg via INTRAVENOUS
  Filled 2021-06-24 (×14): qty 500

## 2021-06-24 MED ORDER — HYDROMORPHONE HCL 1 MG/ML IJ SOLN: 0.5000 mg | INTRAMUSCULAR | Status: DC | PRN

## 2021-06-24 MED ORDER — OXYCODONE HCL 5 MG PO TABS
5.0000 mg | ORAL_TABLET | ORAL | Status: DC | PRN
  Administered 2021-06-24: 10 mg via ORAL
  Administered 2021-06-24: 5 mg via ORAL
  Filled 2021-06-24: qty 2
  Filled 2021-06-24: qty 1
  Filled 2021-06-24: qty 2

## 2021-06-24 NOTE — Progress Notes (Signed)
1 Day Post-Op   Subjective/Chief Complaint: Struggling with pain control; has not really been able to move much.  Some flatus per ostomy.   Objective: Vital signs in last 24 hours: Temp:  [98 F (36.7 C)-99.2 F (37.3 C)] 98.7 F (37.1 C) (07/02 0523) Pulse Rate:  [75-115] 75 (07/02 0523) Resp:  [10-18] 18 (07/02 0523) BP: (121-165)/(77-107) 134/86 (07/02 0523) SpO2:  [94 %-100 %] 99 % (07/02 0523) Last BM Date: 06/23/21  Intake/Output from previous day: 07/01 0701 - 07/02 0700 In: 4529.4 [P.O.:360; I.V.:3869.4; IV Piggyback:300] Out: 3060 [Urine:2350; Drains:310; Blood:400] Intake/Output this shift: No intake/output data recorded.  Alert, pleasant Unlabored respirations Abdomen is soft, diffusely appropriately tender.  Incisions clean dry and intact.  Stoma pink and protuberant with some gas and sweat in the appliance.  Lab Results:  Recent Labs    06/24/21 0443  WBC 15.4*  HGB 13.5  HCT 40.3  PLT 207   BMET Recent Labs    06/24/21 0443  NA 139  K 3.7  CL 103  CO2 28  GLUCOSE 149*  BUN 11  CREATININE 0.92  CALCIUM 8.4*   PT/INR No results for input(s): LABPROT, INR in the last 72 hours. ABG No results for input(s): PHART, HCO3 in the last 72 hours.  Invalid input(s): PCO2, PO2  Studies/Results: No results found.  Anti-infectives: Anti-infectives (From admission, onward)    Start     Dose/Rate Route Frequency Ordered Stop   06/24/21 0200  cefoTEtan (CEFOTAN) 2 g in sodium chloride 0.9 % 100 mL IVPB        2 g 200 mL/hr over 30 Minutes Intravenous Every 12 hours 06/23/21 1441 06/24/21 0256   06/23/21 1245  cefoTEtan (CEFOTAN) 2 g in dextrose 5 % 50 mL IVPB  Status:  Discontinued        2 g 100 mL/hr over 30 Minutes Intravenous On call to O.R. 06/23/21 1243 06/23/21 1441   06/23/21 0600  cefoTEtan (CEFOTAN) 2 g in sodium chloride 0.9 % 100 mL IVPB        2 g 200 mL/hr over 30 Minutes Intravenous On call to O.R. 06/22/21 0820 06/23/21 1344        Assessment/Plan: s/p Procedure(s): XI ROBOTIC ASSISTED PROCTECTOMY WITH J POUCH CREATION AND LOOP ILEOSTOMY (N/A) LAPAROSCOPIC LYSIS OF ADHESIONS (N/A)  Work on pain control, mobilize as able   LOS: 1 day    [employer] 06/24/2021

## 2021-06-25 LAB — CBC
HCT: 40.8 % (ref 39.0–52.0)
Hemoglobin: 13.6 g/dL (ref 13.0–17.0)
MCH: 31.4 pg (ref 26.0–34.0)
MCHC: 33.3 g/dL (ref 30.0–36.0)
MCV: 94.2 fL (ref 80.0–100.0)
Platelets: 186 10*3/uL (ref 150–400)
RBC: 4.33 MIL/uL (ref 4.22–5.81)
RDW: 13.4 % (ref 11.5–15.5)
WBC: 16.7 10*3/uL — ABNORMAL HIGH (ref 4.0–10.5)
nRBC: 0 % (ref 0.0–0.2)

## 2021-06-25 LAB — BASIC METABOLIC PANEL
Anion gap: 8 (ref 5–15)
BUN: 8 mg/dL (ref 6–20)
CO2: 31 mmol/L (ref 22–32)
Calcium: 8.9 mg/dL (ref 8.9–10.3)
Chloride: 99 mmol/L (ref 98–111)
Creatinine, Ser: 0.73 mg/dL (ref 0.61–1.24)
GFR, Estimated: >60 mL/min (ref >60)
Glucose, Bld: 130 mg/dL — ABNORMAL HIGH (ref 70–99)
Potassium: 3.5 mmol/L (ref 3.5–5.1)
Sodium: 138 mmol/L (ref 135–145)

## 2021-06-25 MED ORDER — LACTATED RINGERS IV SOLN: INTRAVENOUS | Status: DC

## 2021-06-25 MED ORDER — PSYLLIUM 95 % PO PACK
1.0000 | PACK | Freq: Every day | ORAL | Status: DC
  Administered 2021-06-25 – 2021-06-26 (×2): 1 via ORAL
  Filled 2021-06-25 (×2): qty 1

## 2021-06-25 NOTE — Progress Notes (Signed)
2 Days Post-Op   Subjective/Chief Complaint: Pain better today. Up in chair. Ostomy thin and bilious with lots of gas as well. No n/v. Tolerating fulls. Foley out and voiding well on his own   Objective: Vital signs in last 24 hours: Temp:  [98 F (36.7 C)-99 F (37.2 C)] 98 F (36.7 C) (07/03 0446) Pulse Rate:  [70-76] 76 (07/03 0446) Resp:  [16-18] 18 (07/03 0446) BP: (122-147)/(88-91) 147/88 (07/03 0446) SpO2:  [91 %-99 %] 91 % (07/03 0446) Last BM Date: 06/24/21 (liquid in the ostomy bag)  Intake/Output from previous day: 07/02 0701 - 07/03 0700 In: 1082.8 [P.O.:960; IV Piggyback:122.8] Out: 3685 [Urine:2800; Drains:235; Stool:650] Intake/Output this shift: No intake/output data recorded.  Alert, pleasant Unlabored respirations Abdomen is soft, mildly tender, not significantly distended. Stoma pink with rod in place. Productive of both gas and liquid stool.  Incisions clean dry and intact.   Lab Results:  Recent Labs    06/24/21 0443 06/25/21 0404  WBC 15.4* 16.7*  HGB 13.5 13.6  HCT 40.3 40.8  PLT 207 186   BMET Recent Labs    06/24/21 0443 06/25/21 0404  NA 139 138  K 3.7 3.5  CL 103 99  CO2 28 31  GLUCOSE 149* 130*  BUN 11 8  CREATININE 0.92 0.73  CALCIUM 8.4* 8.9   PT/INR No results for input(s): LABPROT, INR in the last 72 hours. ABG No results for input(s): PHART, HCO3 in the last 72 hours.  Invalid input(s): PCO2, PO2  Studies/Results: No results found.  Anti-infectives: Anti-infectives (From admission, onward)    Start     Dose/Rate Route Frequency Ordered Stop   06/24/21 0200  cefoTEtan (CEFOTAN) 2 g in sodium chloride 0.9 % 100 mL IVPB        2 g 200 mL/hr over 30 Minutes Intravenous Every 12 hours 06/23/21 1441 06/24/21 0256   06/23/21 1245  cefoTEtan (CEFOTAN) 2 g in dextrose 5 % 50 mL IVPB  Status:  Discontinued        2 g 100 mL/hr over 30 Minutes Intravenous On call to O.R. 06/23/21 1243 06/23/21 1441   06/23/21 0600   cefoTEtan (CEFOTAN) 2 g in sodium chloride 0.9 % 100 mL IVPB        2 g 200 mL/hr over 30 Minutes Intravenous On call to O.R. 06/22/21 0820 06/23/21 1344       Assessment/Plan: s/p Procedure(s): XI ROBOTIC ASSISTED PROCTECTOMY WITH J POUCH CREATION AND LOOP ILEOSTOMY (N/A) LAPAROSCOPIC LYSIS OF ADHESIONS (N/A)  Ambulate Adv to soft diet 2.8L recorded from ileostomy -restart MIVF @ 125 cc/hr Repeat electrolytes tomorrow Start daily psyllium; anticipate as diet initiated it will thicken and slow -Ppx: Lov, SCDs   LOS: 2 days    Brian Blackburn 06/25/2021

## 2021-06-26 LAB — BASIC METABOLIC PANEL
Anion gap: 8 (ref 5–15)
BUN: 12 mg/dL (ref 6–20)
CO2: 33 mmol/L — ABNORMAL HIGH (ref 22–32)
Calcium: 8.8 mg/dL — ABNORMAL LOW (ref 8.9–10.3)
Chloride: 98 mmol/L (ref 98–111)
Creatinine, Ser: 0.64 mg/dL (ref 0.61–1.24)
GFR, Estimated: >60 mL/min (ref >60)
Glucose, Bld: 102 mg/dL — ABNORMAL HIGH (ref 70–99)
Potassium: 2.9 mmol/L — ABNORMAL LOW (ref 3.5–5.1)
Sodium: 139 mmol/L (ref 135–145)

## 2021-06-26 LAB — CBC
HCT: 37.1 % — ABNORMAL LOW (ref 39.0–52.0)
Hemoglobin: 12.1 g/dL — ABNORMAL LOW (ref 13.0–17.0)
MCH: 30.7 pg (ref 26.0–34.0)
MCHC: 32.6 g/dL (ref 30.0–36.0)
MCV: 94.2 fL (ref 80.0–100.0)
Platelets: 183 10*3/uL (ref 150–400)
RBC: 3.94 MIL/uL — ABNORMAL LOW (ref 4.22–5.81)
RDW: 13.4 % (ref 11.5–15.5)
WBC: 11.8 10*3/uL — ABNORMAL HIGH (ref 4.0–10.5)
nRBC: 0 % (ref 0.0–0.2)

## 2021-06-26 MED ORDER — POTASSIUM CHLORIDE 20 MEQ PO PACK
60.0000 meq | PACK | Freq: Once | ORAL | Status: AC
  Administered 2021-06-26: 60 meq via ORAL
  Filled 2021-06-26: qty 3

## 2021-06-26 MED ORDER — PSYLLIUM 95 % PO PACK
1.0000 | PACK | Freq: Two times a day (BID) | ORAL | Status: DC
  Administered 2021-06-26 – 2021-06-27 (×3): 1 via ORAL
  Filled 2021-06-26 (×4): qty 1

## 2021-06-26 MED ORDER — POTASSIUM CHLORIDE 10 MEQ/100ML IV SOLN
10.0000 meq | INTRAVENOUS | Status: AC
  Administered 2021-06-26 (×4): 10 meq via INTRAVENOUS
  Filled 2021-06-26 (×4): qty 100

## 2021-06-26 MED ORDER — SODIUM CHLORIDE 0.9 % IV SOLN
INTRAVENOUS | Status: DC | PRN
  Administered 2021-06-26: 250 mL via INTRAVENOUS

## 2021-06-26 NOTE — Progress Notes (Signed)
3 Days Post-Op   Subjective/Chief Complaint: Pain control is good.  Tolerating a diet without nausea, bloating or pain.  Walking in halls.   Objective: Vital signs in last 24 hours: Temp:  [98.1 F (36.7 C)-98.9 F (37.2 C)] 98.9 F (37.2 C) (07/04 0631) Pulse Rate:  [66-70] 70 (07/04 0631) Resp:  [18] 18 (07/04 0631) BP: (116-124)/(78-84) 124/80 (07/04 0631) SpO2:  [93 %-99 %] 99 % (07/04 0631) Last BM Date: 06/25/21  Intake/Output from previous day: 07/03 0701 - 07/04 0700 In: 3393.6 [P.O.:960; I.V.:2189.7; IV Piggyback:243.9] Out: 3450 [Urine:1750; Drains:375; Stool:1325] Intake/Output this shift: No intake/output data recorded.  Alert, pleasant Unlabored respirations Abdomen is soft, mildly appropriately tender, not significantly distended. Stoma pink with rod in place. Productive of both gas and liquid stool which is thin and bilious.  Incisions clean dry and intact, no cellulitis or hematoma.   Lab Results:  Recent Labs    06/25/21 0404 06/26/21 0438  WBC 16.7* 11.8*  HGB 13.6 12.1*  HCT 40.8 37.1*  PLT 186 183    BMET Recent Labs    06/25/21 0404 06/26/21 0438  NA 138 139  K 3.5 2.9*  CL 99 98  CO2 31 33*  GLUCOSE 130* 102*  BUN 8 12  CREATININE 0.73 0.64  CALCIUM 8.9 8.8*    PT/INR No results for input(s): LABPROT, INR in the last 72 hours. ABG No results for input(s): PHART, HCO3 in the last 72 hours.  Invalid input(s): PCO2, PO2  Studies/Results: No results found.  Anti-infectives: Anti-infectives (From admission, onward)    Start     Dose/Rate Route Frequency Ordered Stop   06/24/21 0200  cefoTEtan (CEFOTAN) 2 g in sodium chloride 0.9 % 100 mL IVPB        2 g 200 mL/hr over 30 Minutes Intravenous Every 12 hours 06/23/21 1441 06/24/21 0256   06/23/21 1245  cefoTEtan (CEFOTAN) 2 g in dextrose 5 % 50 mL IVPB  Status:  Discontinued        2 g 100 mL/hr over 30 Minutes Intravenous On call to O.R. 06/23/21 1243 06/23/21 1441   06/23/21  0600  cefoTEtan (CEFOTAN) 2 g in sodium chloride 0.9 % 100 mL IVPB        2 g 200 mL/hr over 30 Minutes Intravenous On call to O.R. 06/22/21 0820 06/23/21 1344       Assessment/Plan: s/p Procedure(s): XI ROBOTIC ASSISTED PROCTECTOMY WITH J POUCH CREATION AND LOOP ILEOSTOMY (N/A) LAPAROSCOPIC LYSIS OF ADHESIONS (N/A)  Ambulate Adv to regular diet Hypokalemia 2.9-replace IV and p.o. Ostomy output down to 1325; will decrease IV fluids and continue current regimen (daily psyllium) Repeat electrolytes tomorrow -Ppx: Lov, SCDs  Possible discharge tomorrow   LOS: 3 days    Berna Bue 06/26/2021

## 2021-06-27 LAB — SURGICAL PATHOLOGY

## 2021-06-27 MED ORDER — LOPERAMIDE HCL 2 MG PO CAPS
2.0000 mg | ORAL_CAPSULE | Freq: Three times a day (TID) | ORAL | Status: DC
  Administered 2021-06-27 (×2): 2 mg via ORAL
  Filled 2021-06-27 (×2): qty 1

## 2021-06-27 NOTE — Progress Notes (Signed)
4 Days Post-Op robotic proctectomy, j pouch creation and loop ileostomy  Subjective/Chief Complaint: Pain control is good.  Tolerating a diet without nausea, bloating or pain.  Walking in halls.   Objective: Vital signs in last 24 hours: Temp:  [98.2 F (36.8 C)-99.1 F (37.3 C)] 98.3 F (36.8 C) (07/05 0602) Pulse Rate:  [77-79] 77 (07/05 0602) Resp:  [16] 16 (07/05 0602) BP: (120-130)/(75-85) 120/75 (07/05 0602) SpO2:  [97 %-98 %] 97 % (07/05 0602) Weight:  [114.2 kg] 114.2 kg (07/05 0500) Last BM Date: 06/26/21  Intake/Output from previous day: 07/04 0701 - 07/05 0700 In: 3347.6 [P.O.:1316; I.V.:1582.4; IV Piggyback:449.2] Out: 3725 [Urine:1800; Drains:25; Stool:1900] Intake/Output this shift: No intake/output data recorded.  Alert, pleasant Unlabored respirations Abdomen is soft, mildly appropriately tender, not distended. Stoma pink with rod in place. Productive of both gas and liquid stool which is thin and bilious.   Incisions clean dry and intact, no cellulitis or hematoma.   Lab Results:  Recent Labs    06/25/21 0404 06/26/21 0438  WBC 16.7* 11.8*  HGB 13.6 12.1*  HCT 40.8 37.1*  PLT 186 183    BMET Recent Labs    06/25/21 0404 06/26/21 0438  NA 138 139  K 3.5 2.9*  CL 99 98  CO2 31 33*  GLUCOSE 130* 102*  BUN 8 12  CREATININE 0.73 0.64  CALCIUM 8.9 8.8*    PT/INR No results for input(s): LABPROT, INR in the last 72 hours. ABG No results for input(s): PHART, HCO3 in the last 72 hours.  Invalid input(s): PCO2, PO2  Studies/Results: No results found.  Anti-infectives: Anti-infectives (From admission, onward)    Start     Dose/Rate Route Frequency Ordered Stop   06/24/21 0200  cefoTEtan (CEFOTAN) 2 g in sodium chloride 0.9 % 100 mL IVPB        2 g 200 mL/hr over 30 Minutes Intravenous Every 12 hours 06/23/21 1441 06/24/21 0256   06/23/21 1245  cefoTEtan (CEFOTAN) 2 g in dextrose 5 % 50 mL IVPB  Status:  Discontinued        2 g 100  mL/hr over 30 Minutes Intravenous On call to O.R. 06/23/21 1243 06/23/21 1441   06/23/21 0600  cefoTEtan (CEFOTAN) 2 g in sodium chloride 0.9 % 100 mL IVPB        2 g 200 mL/hr over 30 Minutes Intravenous On call to O.R. 06/22/21 0820 06/23/21 1344       Assessment/Plan: s/p Procedure(s): XI ROBOTIC ASSISTED PROCTECTOMY WITH J POUCH CREATION AND LOOP ILEOSTOMY (N/A) LAPAROSCOPIC LYSIS OF ADHESIONS (N/A)  Ambulate Adv to regular diet Hypokalemia, replaced yesterday.  Repeat labs in AM Ostomy output at ; will cont IV fluids and continue current regimen (daily psyllium) and add TID imodium -Ppx: Lov, SCDs  Possible discharge tomorrow   LOS: 4 days    Brian Blackburn 06/27/2021

## 2021-06-28 LAB — BASIC METABOLIC PANEL
Anion gap: 9 (ref 5–15)
BUN: 14 mg/dL (ref 6–20)
CO2: 30 mmol/L (ref 22–32)
Calcium: 9.1 mg/dL (ref 8.9–10.3)
Chloride: 100 mmol/L (ref 98–111)
Creatinine, Ser: 0.86 mg/dL (ref 0.61–1.24)
GFR, Estimated: >60 mL/min (ref >60)
Glucose, Bld: 103 mg/dL — ABNORMAL HIGH (ref 70–99)
Potassium: 3.4 mmol/L — ABNORMAL LOW (ref 3.5–5.1)
Sodium: 139 mmol/L (ref 135–145)

## 2021-06-28 MED ORDER — CALCIUM POLYCARBOPHIL 625 MG PO TABS
625.0000 mg | ORAL_TABLET | Freq: Three times a day (TID) | ORAL | Status: DC
  Administered 2021-06-28 – 2021-06-30 (×7): 625 mg via ORAL
  Filled 2021-06-28 (×7): qty 1

## 2021-06-28 MED ORDER — LOPERAMIDE HCL 2 MG PO CAPS
4.0000 mg | ORAL_CAPSULE | Freq: Three times a day (TID) | ORAL | Status: DC
  Administered 2021-06-28: 4 mg via ORAL
  Filled 2021-06-28 (×2): qty 2

## 2021-06-28 NOTE — Progress Notes (Signed)
5 Days Post-Op robotic proctectomy, j pouch creation and loop ileostomy  Subjective/Chief Complaint: Pain control is good.  Tolerating a diet without nausea, bloating or pain.  Walking in halls.   Objective: Vital signs in last 24 hours: Temp:  [98 F (36.7 C)-99.3 F (37.4 C)] 98 F (36.7 C) (07/06 0529) Pulse Rate:  [62-86] 62 (07/06 0529) Resp:  [17-19] 18 (07/06 0529) BP: (111-116)/(68-77) 113/73 (07/06 0529) SpO2:  [96 %-99 %] 98 % (07/06 0529) Last BM Date: 06/26/21  Intake/Output from previous day: 07/05 0701 - 07/06 0700 In: 1773.2 [P.O.:340; I.V.:1233.2; IV Piggyback:200] Out: 3200 [Urine:1500; Stool:1700] Intake/Output this shift: No intake/output data recorded.  Alert, pleasant Unlabored respirations Abdomen is soft, min tender, not distended. Stoma pink with rod in place. Productive of both gas and liquid stool which is thin and bilious.   Incisions clean dry and intact, no cellulitis or hematoma.   Lab Results:  Recent Labs    06/26/21 0438  WBC 11.8*  HGB 12.1*  HCT 37.1*  PLT 183    BMET Recent Labs    06/26/21 0438 06/28/21 0258  NA 139 139  K 2.9* 3.4*  CL 98 100  CO2 33* 30  GLUCOSE 102* 103*  BUN 12 14  CREATININE 0.64 0.86  CALCIUM 8.8* 9.1    PT/INR No results for input(s): LABPROT, INR in the last 72 hours. ABG No results for input(s): PHART, HCO3 in the last 72 hours.  Invalid input(s): PCO2, PO2  Studies/Results: No results found.  Anti-infectives: Anti-infectives (From admission, onward)    Start     Dose/Rate Route Frequency Ordered Stop   06/24/21 0200  cefoTEtan (CEFOTAN) 2 g in sodium chloride 0.9 % 100 mL IVPB        2 g 200 mL/hr over 30 Minutes Intravenous Every 12 hours 06/23/21 1441 06/24/21 0256   06/23/21 1245  cefoTEtan (CEFOTAN) 2 g in dextrose 5 % 50 mL IVPB  Status:  Discontinued        2 g 100 mL/hr over 30 Minutes Intravenous On call to O.R. 06/23/21 1243 06/23/21 1441   06/23/21 0600  cefoTEtan  (CEFOTAN) 2 g in sodium chloride 0.9 % 100 mL IVPB        2 g 200 mL/hr over 30 Minutes Intravenous On call to O.R. 06/22/21 0820 06/23/21 1344       Assessment/Plan: s/p Procedure(s): XI ROBOTIC ASSISTED PROCTECTOMY WITH J POUCH CREATION AND LOOP ILEOSTOMY (N/A) LAPAROSCOPIC LYSIS OF ADHESIONS (N/A)  Ambulate Adv to regular diet Hypokalemia, better today Ostomy output at ; will hold IV fluids and TID fiber pills and increase to 4mg  TID imodium Saline lock -Ppx: Lov, SCDs  Possible discharge tomorrow   LOS: 5 days    06/28/2021

## 2021-06-28 NOTE — Consult Note (Signed)
WOC Nurse ostomy follow up Patient receiving care in [facility]. Stoma type/location: RUQ ileostomy Stomal assessment/size: red, moist, slightly budded, red rubber support rod in place, area slightly oval shaped Peristomal assessment: some redness and irritation from existing pouch leakage Treatment options for stomal/peristomal skin: crusting with stoma powder and skin barrier film, and barrier ring Output: none in existing pouch Ostomy pouching: 2pc. 2 and 3/4 inch skin barrier and pouch, and barrier ring Education provided: how to crust with stoma powder and skin film barrier, how to place barrier ring around the stoma, how to use the cut skin barrier backing as a pattern for the next application. The patient has had a stoma for some time now and already has access to product obtainment at home.  No further needs identified at this time. Patient expressed his appreciation of care and information provided today. Thank you for the consult.  Discussed plan of care with the patient and bedside nurse.  WOC nurse will not follow at this time.  Please re-consult the WOC team if needed.  Helmut Muster, RN, MSN, CWOCN, CNS-BC, pager [phone]

## 2021-06-28 NOTE — Progress Notes (Signed)
Order received to remove ostomy bar this AM, however patient had just changed ostomy appliance and requested that we do this at the next bag change,

## 2021-06-29 MED ORDER — LOPERAMIDE HCL 2 MG PO CAPS
4.0000 mg | ORAL_CAPSULE | Freq: Three times a day (TID) | ORAL | Status: DC
  Administered 2021-06-29 – 2021-06-30 (×4): 4 mg via ORAL
  Filled 2021-06-29 (×4): qty 2

## 2021-06-29 MED ORDER — DIPHENOXYLATE-ATROPINE 2.5-0.025 MG PO TABS
1.0000 | ORAL_TABLET | Freq: Four times a day (QID) | ORAL | Status: DC
  Administered 2021-06-29 (×4): 1 via ORAL
  Filled 2021-06-29 (×4): qty 1

## 2021-06-29 MED ORDER — LACTATED RINGERS IV BOLUS
1000.0000 mL | Freq: Once | INTRAVENOUS | Status: AC
  Administered 2021-06-29: 1000 mL via INTRAVENOUS

## 2021-06-29 NOTE — Progress Notes (Signed)
6 Days Post-Op robotic proctectomy, j pouch creation and loop ileostomy  Subjective/Chief Complaint: Pain control is good.  Tolerating a diet without nausea, bloating or pain.  Walking in halls.   Objective: Vital signs in last 24 hours: Temp:  [98.4 F (36.9 C)-98.7 F (37.1 C)] 98.7 F (37.1 C) (07/07 0538) Pulse Rate:  [74-79] 74 (07/07 0538) Resp:  [13-18] 15 (07/07 0538) BP: (116-119)/(66-84) 117/82 (07/07 0538) SpO2:  [99 %-100 %] 99 % (07/07 0538) Weight:  [111.1 kg] 111.1 kg (07/07 0538) Last BM Date: 06/29/21  Intake/Output from previous day: 07/06 0701 - 07/07 0700 In: 240 [P.O.:240] Out: 3230 [Urine:1275; Drains:5; Stool:1950] Intake/Output this shift: No intake/output data recorded.  Alert, pleasant Unlabored respirations Abdomen is soft, min tender, not distended. Stoma pink with rod in place. Productive of both gas and liquid stool which is thin and bilious.   Incisions clean dry and intact, no cellulitis or hematoma.   Lab Results:  No results for input(s): WBC, HGB, HCT, PLT in the last 72 hours.  BMET Recent Labs    06/28/21 0258  NA 139  K 3.4*  CL 100  CO2 30  GLUCOSE 103*  BUN 14  CREATININE 0.86  CALCIUM 9.1    PT/INR No results for input(s): LABPROT, INR in the last 72 hours. ABG No results for input(s): PHART, HCO3 in the last 72 hours.  Invalid input(s): PCO2, PO2  Studies/Results: No results found.  Anti-infectives: Anti-infectives (From admission, onward)    Start     Dose/Rate Route Frequency Ordered Stop   06/24/21 0200  cefoTEtan (CEFOTAN) 2 g in sodium chloride 0.9 % 100 mL IVPB        2 g 200 mL/hr over 30 Minutes Intravenous Every 12 hours 06/23/21 1441 06/24/21 0256   06/23/21 1245  cefoTEtan (CEFOTAN) 2 g in dextrose 5 % 50 mL IVPB  Status:  Discontinued        2 g 100 mL/hr over 30 Minutes Intravenous On call to O.R. 06/23/21 1243 06/23/21 1441   06/23/21 0600  cefoTEtan (CEFOTAN) 2 g in sodium chloride 0.9 % 100  mL IVPB        2 g 200 mL/hr over 30 Minutes Intravenous On call to O.R. 06/22/21 0820 06/23/21 1344       Assessment/Plan: s/p Procedure(s): XI ROBOTIC ASSISTED PROCTECTOMY WITH J POUCH CREATION AND LOOP ILEOSTOMY (N/A) LAPAROSCOPIC LYSIS OF ADHESIONS (N/A)  Ambulate Cont regular diet  Ostomy output at ; COnt TID fiber pills and 4mg  QID imodium, add lomotil QID Dehydration due to high ostomy output: 1L LR bolus  -Ppx: Lov, SCDs  Possible discharge tomorrow   LOS: 6 days    06/29/2021

## 2021-06-30 MED ORDER — DIPHENOXYLATE-ATROPINE 2.5-0.025 MG PO TABS: 1.0000 | ORAL_TABLET | Freq: Four times a day (QID) | ORAL | 2 refills | Status: DC

## 2021-06-30 MED ORDER — LOPERAMIDE HCL 2 MG PO CAPS: 4.0000 mg | ORAL_CAPSULE | Freq: Three times a day (TID) | ORAL | 3 refills | Status: DC

## 2021-06-30 MED ORDER — CALCIUM POLYCARBOPHIL 625 MG PO TABS: 625.0000 mg | ORAL_TABLET | Freq: Three times a day (TID) | ORAL | 3 refills | Status: DC

## 2021-06-30 NOTE — Discharge Instructions (Signed)
ABDOMINAL SURGERY: POST OP INSTRUCTIONS  DIET: Follow a light bland diet the first 24 hours after arrival home, such as soup, liquids, crackers, etc.  Be sure to include lots of fluids daily.  Avoid fast food or heavy meals as your are more likely to get nauseated.  Do not eat any uncooked fruits or vegetables for the next 2 weeks as your colon heals. Take your usually prescribed home medications unless otherwise directed. PAIN CONTROL: Pain is best controlled by a usual combination of three different methods TOGETHER: Ice/Heat Over the counter pain medication  Most patients will experience some swelling and bruising around the incisions.  Ice packs or heating pads (30-60 minutes up to 6 times a day) will help. Use ice for the first few days to help decrease swelling and bruising, then switch to heat to help relax tight/sore spots and speed recovery.  Some people prefer to use ice alone, heat alone, alternating between ice & heat.  Experiment to what works for you.  Swelling and bruising can take several weeks to resolve.   It is helpful to take an over-the-counter pain medication regularly for the first few weeks.  Choose one of the following that works best for you: Naproxen (Aleve, etc)  Two 220mg  tabs twice a day Ibuprofen (Advil, etc) Three 200mg  tabs four times a day (every meal & bedtime) Acetaminophen (Tylenol, etc) 500-650mg  four times a day (every meal & bedtime)  Watch out for diarrhea.   Your ostomy output should stay under 1L per day with your current regimen.  If your urine becomes very dark, that means that your ostomy is putting out too much fluid or you are not drinking enough fluids.  If this happens, simplify your diet to bland foods & plenty of non-caffeinated liquids for a few days.  Avoid spicy foods, fatty foods, roughage and alcohol.  If this worsens or does not improve, please call . Wash / shower every day.  You may shower over the incision / wound.  Avoid baths until the  skin is fully healed.  Continue to shower over incision(s) after the dressing is off. Remove your waterproof bandages 5 days after surgery.  You may leave the incision open to air.  You may replace a dressing/Band-Aid to cover the incision for comfort if you wish. ACTIVITIES as tolerated:   You may resume regular (light) daily activities beginning the next day--such as daily self-care, walking, climbing stairs--gradually increasing activities as tolerated.  If you can walk 30 minutes without difficulty, it is safe to try more intense activity such as jogging, treadmill, bicycling, low-impact aerobics, swimming, etc. Save the most intensive and strenuous activity for last such as sit-ups, heavy lifting, contact sports, etc  Refrain from any heavy lifting or straining until you are off narcotics for pain control.   DO NOT PUSH THROUGH PAIN.  Let pain be your guide: If it hurts to do something, don't do it.  Pain is your body warning you to avoid that activity for another week until the pain goes down. You may drive when you are no longer taking prescription pain medication, you can comfortably wear a seatbelt, and you can safely maneuver your car and apply brakes. You may have sexual intercourse when it is comfortable.  FOLLOW UP in our office Please call [facility] at [phone] to set up an appointment to see your surgeon in the office for a follow-up appointment approximately 1-2 weeks after your surgery. Make sure that you call for  this appointment the day you arrive home to insure a convenient appointment time. 10. IF YOU HAVE DISABILITY OR FAMILY LEAVE FORMS, BRING THEM TO THE OFFICE FOR PROCESSING.  DO NOT GIVE THEM TO YOUR DOCTOR.   WHEN TO CALL us [phone]: Poor pain control Reactions / problems with new medications (rash/itching, nausea, etc)  Fever over 101.5 F (38.5 C) Inability to urinate Nausea and/or vomiting Worsening swelling or bruising Continued bleeding from  incision. Increased pain, redness, or drainage from the incision  The clinic staff is available to answer your questions during regular business hours (8:30am-5pm).  Please don't hesitate to call and ask to speak to one of our nurses for clinical concerns.   A surgeon from [facility] is always on call at the hospitals   If you have a medical emergency, go to the nearest emergency room or call 911.    [facility] [address], [address], [place], [place]  [address] ? MAIN: [phone] ? TOLL FREE: [phone] ? FAX [phone] [email or web address]

## 2021-06-30 NOTE — Discharge Summary (Signed)
Physician Discharge Summary  Patient ID: Brian Blackburn MRN: [id number] DOB/AGE: 04/29/1976 45 y.o.  Admit date: 06/23/2021 Discharge date: 06/30/2021  Admission Diagnoses: Ulcerative colitis  Discharge Diagnoses:  Active Problems:   Ulcerative colitis [facility])   Discharged Condition: good  Hospital Course: Patient was admitted to the hospital after robotic assisted J-pouch creation, proctectomy and diverting loop ileostomy.  His postoperative course has been uncomplicated except for high output ileostomy, which was expected due to the proximity of his ileostomy.  This was finally controlled using a combination of fiber, Imodium and Lomotil.  Once his ostomy output was less than 1 L/day, he was felt to be in stable condition for discharge to home.  Patient has had an ostomy before and is comfortable with pouch changes and monitoring for signs of dehydration.  Consults: None  Significant Diagnostic Studies: labs: cbc, bmet  Treatments: IV hydration, analgesia: acetaminophen, and surgery: robotic assisted proctectomy, j pouch and ileostomy  Discharge Exam: Blood pressure 114/80, pulse 66, temperature 98 F (36.7 C), temperature source Oral, resp. rate 18, height 6\' 1"  (1.854 m), weight 108.4 kg, SpO2 99 %. General appearance: alert and cooperative GI: soft, non-tender; bowel sounds normal; no masses,  no organomegaly Incision/Wound: Clean, dry, intact JP: scant old bloody fluid  Disposition: Discharge disposition: 01-Home or Self Care      Allergies as of 06/30/2021       Reactions   Doxycycline Rash        Medication List     STOP taking these medications    fexofenadine 180 MG tablet Commonly known as: ALLEGRA       TAKE these medications    diphenoxylate-atropine 2.5-0.025 MG tablet Commonly known as: LOMOTIL Take 1 tablet by mouth 4 (four) times daily.   FLUoxetine 10 MG capsule Commonly known as: PROZAC Take 20 mg by mouth at bedtime.   loperamide 2 MG  capsule Commonly known as: IMODIUM Take 2 capsules (4 mg total) by mouth 4 (four) times daily -  with meals and at bedtime.   polycarbophil 625 MG tablet Commonly known as: FIBERCON Take 1 tablet (625 mg total) by mouth 3 (three) times daily.        Follow-up Information     03-12-1990, MD. Schedule an appointment as soon as possible for a visit in 2 week(s).   Specialties: General Surgery, Colon and Rectal Surgery Contact information: [address] [place] [phone]                 Signed: [phone] 06/30/2021, 7:49 AM

## 2021-09-08 ENCOUNTER — Other Ambulatory Visit: Payer: Self-pay

## 2021-09-08 ENCOUNTER — Encounter (HOSPITAL_COMMUNITY)
Admission: RE | Admit: 2021-09-08 | Discharge: 2021-09-08 | Disposition: A | Discharge status: Home / Self Care | Payer: BC Managed Care – PPO | Source: Ambulatory Visit | Attending: Nurse Practitioner | Admitting: Nurse Practitioner

## 2021-09-08 DIAGNOSIS — K9419 Other complications of enterostomy: Secondary | ICD-10-CM | POA: Diagnosis present

## 2021-09-08 DIAGNOSIS — L24B1 Irritant contact dermatitis related to digestive stoma or fistula: Secondary | ICD-10-CM | POA: Diagnosis not present

## 2021-09-08 DIAGNOSIS — Z932 Ileostomy status: Secondary | ICD-10-CM

## 2021-09-08 DIAGNOSIS — L24B3 Irritant contact dermatitis related to fecal or urinary stoma or fistula: Secondary | ICD-10-CM | POA: Insufficient documentation

## 2021-09-08 NOTE — Progress Notes (Signed)
[facility]   Reason for visit:  RLQ Ileostomy with peristomal skin breakdown  HPI:  Bowel perforation with ileostomy  awaiting final step of J pouch procedure.  ROS  Review of Systems  Gastrointestinal:        RLQ ileostomy  Vital signs:  BP 121/85 (BP Location: Right Arm)   Pulse 70   Temp 98 F (36.7 C) (Oral)   Resp 20   Ht 6\' 1"  (1.854 m)   Wt 104.3 kg   SpO2 96%   BMI 30.34 kg/m  Exam:  Physical Exam Skin:    Findings: Wound present.          Comments: RLQ ileostomy with skin breakdown    Stoma type/location:  RLQ ileostomy Stomal assessment/size:  1 3/8" pink and moist  Peristomal assessment:   Deep  midline abdominal scarring   Skin breakdown to distal lower perimeter of pouched skin. 2- full thickness open lesions. Consistent with trauma from medical adhesive related skin injury (MARSI)  Has been using NS moist gauze under pouch and states this has helped some.  I will implement silver  hydrofiber and send home with supplies Treatment options for stomal/peristomal skin: aquacel to open wound.  Barrier ring and paste as well as skin prep to intact skin to prevent stripping.  Output: liquid brown stool Ostomy pouching: 1pc.convex  Education provided:  Process to apply aquacel.  Call office if he needs more or wounds are not healing.     Impression/dx  Peristomal skin breakdown   contact dermatitis Discussion  Implement topical wound orders Plan  Follow up in one week regarding wound healing.     Visit time: 45 minutes.   5/8 FNP-BC

## 2021-09-11 NOTE — Discharge Instructions (Signed)
Aquacel Ag to open wound.  Pouch as normal over this area.  See me back in one week if no improvement.

## 2021-09-25 ENCOUNTER — Other Ambulatory Visit: Payer: Self-pay | Admitting: General Surgery

## 2021-09-25 DIAGNOSIS — Z939 Artificial opening status, unspecified: Secondary | ICD-10-CM

## 2021-09-25 DIAGNOSIS — Z9889 Other specified postprocedural states: Secondary | ICD-10-CM

## 2021-09-28 ENCOUNTER — Ambulatory Visit
Admission: RE | Admit: 2021-09-28 | Discharge: 2021-09-28 | Disposition: A | Discharge status: Home / Self Care | Payer: BC Managed Care – PPO | Source: Ambulatory Visit | Attending: General Surgery | Admitting: General Surgery

## 2021-09-28 DIAGNOSIS — Z9889 Other specified postprocedural states: Secondary | ICD-10-CM

## 2021-09-28 DIAGNOSIS — Z939 Artificial opening status, unspecified: Secondary | ICD-10-CM

## 2021-10-16 ENCOUNTER — Ambulatory Visit: Payer: Self-pay | Admitting: General Surgery

## 2021-10-18 ENCOUNTER — Encounter (HOSPITAL_COMMUNITY): Payer: Self-pay | Admitting: General Surgery

## 2021-10-27 ENCOUNTER — Ambulatory Visit (HOSPITAL_COMMUNITY)
Admission: RE | Admit: 2021-10-27 | Discharge: 2021-10-27 | Disposition: A | Discharge status: Home / Self Care | Payer: BC Managed Care – PPO | Attending: General Surgery | Admitting: General Surgery

## 2021-10-27 ENCOUNTER — Ambulatory Visit: Payer: Self-pay | Admitting: General Surgery

## 2021-10-27 ENCOUNTER — Encounter (HOSPITAL_COMMUNITY)
Admission: RE | Disposition: A | Discharge status: Home / Self Care | Payer: Self-pay | Source: Home / Self Care | Attending: General Surgery

## 2021-10-27 ENCOUNTER — Other Ambulatory Visit: Payer: Self-pay

## 2021-10-27 DIAGNOSIS — Z09 Encounter for follow-up examination after completed treatment for conditions other than malignant neoplasm: Secondary | ICD-10-CM | POA: Insufficient documentation

## 2021-10-27 DIAGNOSIS — K624 Stenosis of anus and rectum: Secondary | ICD-10-CM | POA: Insufficient documentation

## 2021-10-27 DIAGNOSIS — Z881 Allergy status to other antibiotic agents status: Secondary | ICD-10-CM | POA: Diagnosis not present

## 2021-10-27 DIAGNOSIS — Z9049 Acquired absence of other specified parts of digestive tract: Secondary | ICD-10-CM | POA: Diagnosis not present

## 2021-10-27 DIAGNOSIS — Z8249 Family history of ischemic heart disease and other diseases of the circulatory system: Secondary | ICD-10-CM | POA: Diagnosis not present

## 2021-10-27 HISTORY — PX: POUCHOSCOPY: SHX6321

## 2021-10-27 SURGERY — ENDOSCOPY, POUCH, SMALL INTESTINE, DIAGNOSTIC

## 2021-10-27 MED ORDER — SODIUM CHLORIDE 0.9% FLUSH: 3.0000 mL | Freq: Two times a day (BID) | INTRAVENOUS | Status: DC

## 2021-10-27 MED ORDER — MIDAZOLAM HCL (PF) 5 MG/ML IJ SOLN
INTRAMUSCULAR | Status: DC | PRN
  Administered 2021-10-27: 1 mg via INTRAVENOUS
  Administered 2021-10-27 (×2): 2 mg via INTRAVENOUS

## 2021-10-27 MED ORDER — MIDAZOLAM HCL (PF) 5 MG/ML IJ SOLN
INTRAMUSCULAR | Status: AC
  Filled 2021-10-27: qty 2

## 2021-10-27 MED ORDER — FENTANYL CITRATE (PF) 100 MCG/2ML IJ SOLN
INTRAMUSCULAR | Status: DC | PRN
  Administered 2021-10-27 (×3): 25 ug via INTRAVENOUS

## 2021-10-27 MED ORDER — FENTANYL CITRATE (PF) 100 MCG/2ML IJ SOLN
INTRAMUSCULAR | Status: AC
  Filled 2021-10-27: qty 2

## 2021-10-27 MED ORDER — SODIUM CHLORIDE 0.9 % IV SOLN: INTRAVENOUS | Status: DC

## 2021-10-27 MED ORDER — LACTATED RINGERS IV SOLN
INTRAVENOUS | Status: DC
  Administered 2021-10-27: 1000 mL via INTRAVENOUS

## 2021-10-27 NOTE — Discharge Instructions (Signed)
Post Colonoscopy Instructions  DIET: Follow a light bland diet the first 24 hours after arrival home, such as soup, liquids, crackers, etc.  Be sure to include lots of fluids daily.  Avoid fast food or heavy meals as your are more likely to get nauseated.   You may have some mild rectal bleeding for the first few days after the procedure.  This should get less and less with time.  Resume any blood thinners 2 days after your procedure unless directed otherwise by your physician. Take your usually prescribed home medications unless otherwise directed. If you have any pain, it is helpful to get up and walk around, as it is usually from excess gas. If this is not helpful, you can take an over-the-counter pain medication.  Choose one of the following that works best for you: Naproxen (Aleve, etc)  Two 220mg tabs twice a day Ibuprofen (Advil, etc) Three 200mg tabs four times a day (every meal & bedtime) If you still have pain after using one of these, please call the office It is normal to not have a bowel movement for 2-3 days after colonoscopy.    ACTIVITIES as tolerated:   You may resume regular (light) daily activities beginning the next day--such as daily self-care, walking, climbing stairs--gradually increasing activities as tolerated.    WHEN TO CALL US [phone]: Fever over 101.5 F (38.5 C)  Severe abdominal or chest pain  Large amount of rectal bleeding, passing multiple blood clots  Dizziness or shortness of breath Increasing nausea or vomiting   The clinic staff is available to answer your questions during regular business hours (8:30am-5pm).  Please don't hesitate to call and ask to speak to one of our nurses for clinical concerns.   If you have a medical emergency, go to the nearest emergency room or call 911.  A surgeon from [facility] is always on call at the hospitals   [facility] [address], [address], [place],   [address]  ? MAIN: [phone] ? TOLL FREE: [phone] ?  FAX [phone] [email or web address]   

## 2021-10-27 NOTE — H&P (Signed)
     HPI: Brian Blackburn is an 45 y.o. male who is here for pouchoscopy after j pouch creation  Past Medical History:  Diagnosis Date   Anxiety    PONV (postoperative nausea and vomiting)    AS A SMALL CHILD    Ulcerative colitis [facility])     Past Surgical History:  Procedure Laterality Date   BIOPSY  01/26/2021   Procedure: BIOPSY;  Surgeon: Romie Levee, MD;  Location: [facility];  Service: Endoscopy;;   ENDOVENOUS ABLATION SAPHENOUS VEIN W/ LASER     FLEXIBLE SIGMOIDOSCOPY N/A 01/26/2021   Procedure: FLEXIBLE SIGMOIDOSCOPY;  Surgeon: Romie Levee, MD;  Location: [facility];  Service: Endoscopy;  Laterality: N/A;   LAPAROSCOPIC LYSIS OF ADHESIONS N/A 06/23/2021   Procedure: LAPAROSCOPIC LYSIS OF ADHESIONS;  Surgeon: Romie Levee, MD;  Location: [facility];  Service: General;  Laterality: N/A;   XI ROBOTIC ASSISTED LOWER ANTERIOR RESECTION N/A 06/23/2021   Procedure: XI ROBOTIC ASSISTED PROCTECTOMY WITH J POUCH CREATION AND LOOP ILEOSTOMY;  Surgeon: Romie Levee, MD;  Location: [facility];  Service: General;  Laterality: N/A;    Family History  Problem Relation Age of Onset   Heart failure Father     Social:  reports that he has quit smoking. His smokeless tobacco use includes chew. He reports current alcohol use. He reports that he does not use drugs.  Allergies:  Allergies  Allergen Reactions   Azithromycin Rash   Doxycycline Rash    Medications: I have reviewed the patient's current medications.  No results found for this or any previous visit (from the past 48 hour(s)).  No results found.  ROS - all of the below systems have been reviewed with the patient and positives are indicated with bold text General: chills, fever or night sweats Eyes: blurry vision or double vision ENT: epistaxis or sore throat Allergy/Immunology: itchy/watery eyes or nasal congestion Hematologic/Lymphatic: bleeding problems, blood clots or swollen lymph nodes Endocrine: temperature  intolerance or unexpected weight changes Breast: new or changing breast lumps or nipple discharge Resp: cough, shortness of breath, or wheezing CV: chest pain or dyspnea on exertion GI: as per HPI GU: dysuria, trouble voiding, or hematuria MSK: joint pain or joint stiffness Neuro: TIA or stroke symptoms Derm: pruritus and skin lesion changes Psych: anxiety and depression  PE Blood pressure 126/87, pulse 69, temperature 97.9 F (36.6 C), temperature source Oral, resp. rate 13, height 6\' 1"  (1.854 m), weight 106.6 kg, SpO2 99 %. Constitutional: NAD; conversant; no deformities Eyes: Moist conjunctiva; no lid lag; anicteric; PERRL Neck: Trachea midline; no thyromegaly Lungs: Normal respiratory effort; no tactile fremitus CV: RRR; no palpable thrills; no pitting edema GI: Abd soft; no palpable hepatosplenomegaly MSK: Normal range of motion of extremities; no clubbing/cyanosis Psychiatric: Appropriate affect; alert and oriented x3 Lymphatic: No palpable cervical or axillary lymphadenopathy  No results found for this or any previous visit (from the past 48 hour(s)).  No results found.   A/P: Brian Blackburn is an 45 y.o. male with recently created J pouch after proctectomy.  He has no leak on Gastrogaffin enema.  He is here today for pouchoscopy.  Risks of bleeding, pain and a small risk of perforation were discussed with the patient.     57, MD  Colorectal and General Surgery [facility]

## 2021-10-27 NOTE — Op Note (Signed)
[facility] Patient Name: Brian Blackburn Procedure Date: 10/27/2021 MRN: [id number] Attending MD: Romie Levee , MD Date of Birth: 1976/02/11 CSN: [id number] Age: 45 Admit Type: Outpatient Procedure:                Pouchoscopy Indications:              History of total proctocolectomy, Post-operative                            assessment Providers:                Romie Levee, MD, Fransisca Connors, Norman Clay, RN,                            Michele Mcalpine Technician Referring MD:              Medicines:                Fentanyl 75 micrograms IV, Midazolam 5 mg IV Complications:            No immediate complications. Estimated blood loss:                            Minimal. Estimated Blood Loss:      Procedure:                After obtaining informed consent, the endoscope was                            passed under direct vision. Throughout the                            procedure, the patient's blood pressure, pulse, and                            oxygen saturations were monitored continuously. The                            GIF-H190 ([id number]) Olympus endoscope was introduced                            through the ileoanal anastomosis via the anus and                            advanced to the ileoanal pouch. The patient                            tolerated the procedure well. The procedure was                            performed without difficulty. Scope In: Scope Out: Findings:      The digital exam findings include rectal stricture. Dilated to 1 finger       breadth without difficulty. Pertinent negatives include normal sphincter       tone.      The exam was otherwise without abnormality. Impression:               - Rectal  stricture found on digital exam.                           - The examination was otherwise normal.                           - No specimens collected. Recommendation:           - Repeat post-surgical lower GI endoscopy in 1 year                             for surveillance. Procedure Code(s):        --- Professional ---                           [phone], Endoscopic evaluation of small intestinal                            pouch (eg, Kock pouch, ileal reservoir [S or J]);                            diagnostic, including collection of specimen(s) by                            brushing or washing, when performed (separate                            procedure) Diagnosis Code(s):        --- Professional ---                           K62.4, Stenosis of anus and rectum                           Z90.49, Acquired absence of other specified parts                            of digestive tract                           Z09, Encounter for follow-up examination after                            completed treatment for conditions other than                            malignant neoplasm CPT copyright 2019 American Medical Association. All rights reserved. The codes documented in this report are preliminary and upon coder review may  be revised to meet current compliance requirements. Romie Levee, MD Romie Levee, MD 10/27/2021 4:25:40 PM This report has been signed electronically. Number of Addenda: 0

## 2021-10-29 ENCOUNTER — Encounter (HOSPITAL_COMMUNITY): Payer: Self-pay | Admitting: General Surgery

## 2021-11-02 NOTE — Progress Notes (Signed)
DUE TO COVID-19 ONLY ONE VISITOR IS ALLOWED TO COME WITH YOU AND STAY IN THE WAITING ROOM ONLY DURING PRE OP AND PROCEDURE DAY OF SURGERY IF YOU ARE GOING HOME AFTER YOUR SURGERY .IF YOU ARE SPENDING THE NIGHT YOU MAY HAVE 2 VISITORS DAY OF SURGERY WHO MAY VISIT IN YOUR PRIVATE ROOM UNTIL 800 PM AND MUST GO HOME WHEN VISITING HOURS ARE OVER AT 800 PM.   YOU NEED TO HAVE A COVID 19 TEST ON___12/05/2021 ____ @_______ , THIS TEST MUST BE DONE BEFORE SURGERY,  COVID TESTING SITE IS AT [address], [place], AND REMAIN IN YOUR CAR.  THIS IS A DRIVE UP TEST. AFTER YOUR COVID TEST, PLAESE WEAR A MASK OUT IN PUBLIC AND SOCIAL DISTANCE AND WASH YOUR HANDS FREQUENTLY. PLEASE ASK ALL YOUR CLOSE HOUSEHOLD CONTACTS TO WEAR MASK OUT IN PUBLIC AND SOCIAL DISTANCE ALSO.                Brian Blackburn  11/02/2021   Your procedure is scheduled on:  11/30/2021   Report to [facility] Main  Entrance   Report to admitting at    [phone]     Call this number if you have problems the morning of surgery [phone]           REMEMBER: FOLLOW ALL YOUR BOWEL PREP INSTRUCTIONS WITH CLEAR LIQUIDS FROM YOUR SURGEON'S INSTRUCTIONS. DRINK 2 PRESURGERY ENSURE DRINKS THE NIGHT BEFORE SURGERY AT 1000 PM AND 1 PRESURGERY DRINK THE DAY OF THE PROCEDURE 3 HOURS PRIOR TO SCHEDULED SURGERY.  NOTHING BY MOUTH EXCEPT CLEAR LIQUIDS UNTIL THREE HOURS PRIOR TO SCHEDULED SURGERY. PLEASE FINISH PRESURGERY 3RD  ENSURE  DRINK PER SURGEON ORDER 3 HOURS PRIOR TO SCHEDULED SURGERY TIME WHICH NEEDS TO BE COMPLETED AT __0430am _______.     CLEAR LIQUID DIET   Foods Allowed                                                                      Coffee and tea, regular and decaf                           Plain Jell-O any favor except red or purple                                         Fruit ices (not with fruit pulp)                                      Iced Popsicles                                     Carbonated beverages,  regular and diet                                    Cranberry, grape and apple juices Sports drinks like Gatorade Lightly seasoned clear broth  or consume(fat free) Sugar  _____________________________________________________________________      BRUSH YOUR TEETH MORNING OF SURGERY AND RINSE YOUR MOUTH OUT, NO CHEWING GUM CANDY OR MINTS.     Take these medicines the morning of surgery with A SIP OF WATER:  none   DO NOT TAKE ANY DIABETIC MEDICATIONS DAY OF YOUR SURGERY                               You may not have any metal on your body including hair pins and              piercings  Do not wear jewelry, make-up, lotions, powders or perfumes, deodorant             Do not wear nail polish on your fingernails.  Do not shave  48 hours prior to surgery.              Men may shave face and neck.   Do not bring valuables to the hospital. [facility] IS NOT             RESPONSIBLE   FOR VALUABLES.  Contacts, dentures or bridgework may not be worn into surgery.  Leave suitcase in the car. After surgery it may be brought to your room.                 Please read over the following fact sheets you were given: _____________________________________________________________________  [facility] - Preparing for Surgery Before surgery, you can play an important role.  Because skin is not sterile, your skin needs to be as free of germs as possible.  You can reduce the number of germs on your skin by washing with CHG (chlorahexidine gluconate) soap before surgery.  CHG is an antiseptic cleaner which kills germs and bonds with the skin to continue killing germs even after washing. Please DO NOT use if you have an allergy to CHG or antibacterial soaps.  If your skin becomes reddened/irritated stop using the CHG and inform your nurse when you arrive at Short Stay. Do not shave (including legs and underarms) for at least 48 hours prior to the first CHG shower.  You may shave your face/neck. Please  follow these instructions carefully:  1.  Shower with CHG Soap the night before surgery and the  morning of Surgery.  2.  If you choose to wash your hair, wash your hair first as usual with your  normal  shampoo.  3.  After you shampoo, rinse your hair and body thoroughly to remove the  shampoo.                           4.  Use CHG as you would any other liquid soap.  You can apply chg directly  to the skin and wash                       Gently with a scrungie or clean washcloth.  5.  Apply the CHG Soap to your body ONLY FROM THE NECK DOWN.   Do not use on face/ open                           Wound or open sores. Avoid contact with eyes, ears mouth and genitals (private parts).  Wash face,  Genitals (private parts) with your normal soap.             6.  Wash thoroughly, paying special attention to the area where your surgery  will be performed.  7.  Thoroughly rinse your body with warm water from the neck down.  8.  DO NOT shower/wash with your normal soap after using and rinsing off  the CHG Soap.                9.  Pat yourself dry with a clean towel.            10.  Wear clean pajamas.            11.  Place clean sheets on your bed the night of your first shower and do not  sleep with pets. Day of Surgery : Do not apply any lotions/deodorants the morning of surgery.  Please wear clean clothes to the hospital/surgery center.  FAILURE TO FOLLOW THESE INSTRUCTIONS MAY RESULT IN THE CANCELLATION OF YOUR SURGERY PATIENT SIGNATURE_________________________________  NURSE SIGNATURE__________________________________  ________________________________________________________________________

## 2021-11-02 NOTE — Progress Notes (Addendum)
Anesthesia Review:  PCP: [facility] DR 10/18/21- lov bRENDA MANFREDI  Cardiologist : none  Chest x-ray : EKG : Echo : Stress test: Cardiac Cath :  Activity level: can do a flight of stairs without difficulty  Sleep Study/ CPAP : none  Fasting Blood Sugar :      / Checks Blood Sugar -- times a day:   Blood Thinner/ Instructions /Last Dose: ASA / Instructions/ Last Dose :   Called and spoke with Shamire, Triage Nurse at [facility] and she stated no bowel prep.  She was on speakerphone at time call was made so pt is aware no bowel prep day before .  Only clearliquid diet the day before.  PT voiced understanding.  PT reports at preop MD has told him he is close to being prediabetes.  HGBA1C- 5.7 ON 10/18/21  Covid test on 11/27/2021

## 2021-11-06 ENCOUNTER — Other Ambulatory Visit (HOSPITAL_COMMUNITY): Payer: Self-pay

## 2021-11-07 ENCOUNTER — Encounter (HOSPITAL_COMMUNITY)
Admission: RE | Admit: 2021-11-07 | Discharge: 2021-11-07 | Disposition: A | Discharge status: Home / Self Care | Payer: BC Managed Care – PPO | Source: Ambulatory Visit | Attending: General Surgery | Admitting: General Surgery

## 2021-11-07 ENCOUNTER — Other Ambulatory Visit: Payer: Self-pay

## 2021-11-07 ENCOUNTER — Other Ambulatory Visit (HOSPITAL_COMMUNITY): Payer: Self-pay

## 2021-11-07 ENCOUNTER — Encounter (HOSPITAL_COMMUNITY): Payer: Self-pay

## 2021-11-07 VITALS — BP 138/81 | HR 68 | Temp 97.9°F | Resp 16 | Ht 73.0 in | Wt 230.0 lb

## 2021-11-07 DIAGNOSIS — K5100 Ulcerative (chronic) pancolitis without complications: Secondary | ICD-10-CM

## 2021-11-07 DIAGNOSIS — Z01812 Encounter for preprocedural laboratory examination: Secondary | ICD-10-CM | POA: Diagnosis not present

## 2021-11-07 LAB — CBC
HCT: 46.9 % (ref 39.0–52.0)
Hemoglobin: 16 g/dL (ref 13.0–17.0)
MCH: 30 pg (ref 26.0–34.0)
MCHC: 34.1 g/dL (ref 30.0–36.0)
MCV: 88 fL (ref 80.0–100.0)
Platelets: 243 10*3/uL (ref 150–400)
RBC: 5.33 MIL/uL (ref 4.22–5.81)
RDW: 13.6 % (ref 11.5–15.5)
WBC: 7.9 10*3/uL (ref 4.0–10.5)
nRBC: 0 % (ref 0.0–0.2)

## 2021-11-27 ENCOUNTER — Other Ambulatory Visit: Payer: Self-pay | Admitting: General Surgery

## 2021-11-28 LAB — SARS CORONAVIRUS 2 (TAT 6-24 HRS): SARS Coronavirus 2: NEGATIVE

## 2021-11-29 NOTE — Anesthesia Preprocedure Evaluation (Addendum)
Anesthesia Evaluation  Patient identified by MRN, date of birth, ID band Patient awake    Reviewed: Allergy & Precautions, NPO status , Patient's Chart, lab work & pertinent test results  History of Anesthesia Complications Negative for: history of anesthetic complications  Airway Mallampati: I  TM Distance: >3 FB Neck ROM: Full  Mouth opening: Limited Mouth Opening  Dental  (+) Teeth Intact   Pulmonary neg pulmonary ROS, former smoker,    Pulmonary exam normal breath sounds clear to auscultation       Cardiovascular negative cardio ROS Normal cardiovascular exam     Neuro/Psych PSYCHIATRIC DISORDERS Anxiety Depression negative neurological ROS     GI/Hepatic Neg liver ROS, PUD, Ulcerative colitis   Endo/Other  negative endocrine ROS  Renal/GU negative Renal ROS  negative genitourinary   Musculoskeletal negative musculoskeletal ROS (+)   Abdominal Normal abdominal exam  (+)   Peds  Hematology negative hematology ROS (+)   Anesthesia Other Findings   Reproductive/Obstetrics                            Anesthesia Physical  Anesthesia Plan  ASA: 2  Anesthesia Plan: General   Post-op Pain Management: Tylenol PO (pre-op) and Gabapentin PO (pre-op)   Induction: Intravenous  PONV Risk Score and Plan: 3 and Ondansetron, Dexamethasone, Treatment may vary due to age or medical condition, Midazolam and Aprepitant  Airway Management Planned: Oral ETT  Additional Equipment: None  Intra-op Plan:   Post-operative Plan: Extubation in OR  Informed Consent: I have reviewed the patients History and Physical, chart, labs and discussed the procedure including the risks, benefits and alternatives for the proposed anesthesia with the patient or authorized representative who has indicated his/her understanding and acceptance.     Dental advisory given  Plan Discussed with: Anesthesiologist and  CRNA  Anesthesia Plan Comments:        Anesthesia Quick Evaluation

## 2021-11-30 ENCOUNTER — Encounter (HOSPITAL_COMMUNITY): Payer: Self-pay | Admitting: General Surgery

## 2021-11-30 ENCOUNTER — Encounter (HOSPITAL_COMMUNITY)
Admission: RE | Disposition: A | Discharge status: Home / Self Care | Payer: Self-pay | Source: Home / Self Care | Attending: General Surgery

## 2021-11-30 ENCOUNTER — Inpatient Hospital Stay (HOSPITAL_COMMUNITY): Payer: BC Managed Care – PPO | Admitting: Anesthesiology

## 2021-11-30 ENCOUNTER — Other Ambulatory Visit (HOSPITAL_COMMUNITY): Payer: Self-pay

## 2021-11-30 ENCOUNTER — Inpatient Hospital Stay (HOSPITAL_COMMUNITY): Payer: BC Managed Care – PPO | Admitting: Physician Assistant

## 2021-11-30 ENCOUNTER — Inpatient Hospital Stay (HOSPITAL_COMMUNITY)
Admission: RE | Admit: 2021-11-30 | Discharge: 2021-12-02 | DRG: 330 | Disposition: A | Discharge status: Home / Self Care | Payer: BC Managed Care – PPO | Attending: General Surgery | Admitting: General Surgery

## 2021-11-30 ENCOUNTER — Other Ambulatory Visit: Payer: Self-pay

## 2021-11-30 DIAGNOSIS — Z87891 Personal history of nicotine dependence: Secondary | ICD-10-CM

## 2021-11-30 DIAGNOSIS — K5090 Crohn's disease, unspecified, without complications: Secondary | ICD-10-CM | POA: Diagnosis present

## 2021-11-30 DIAGNOSIS — Z20822 Contact with and (suspected) exposure to covid-19: Secondary | ICD-10-CM | POA: Diagnosis present

## 2021-11-30 DIAGNOSIS — Z432 Encounter for attention to ileostomy: Principal | ICD-10-CM

## 2021-11-30 DIAGNOSIS — Z932 Ileostomy status: Secondary | ICD-10-CM

## 2021-11-30 DIAGNOSIS — K5100 Ulcerative (chronic) pancolitis without complications: Secondary | ICD-10-CM

## 2021-11-30 DIAGNOSIS — K624 Stenosis of anus and rectum: Secondary | ICD-10-CM | POA: Diagnosis present

## 2021-11-30 DIAGNOSIS — Z9049 Acquired absence of other specified parts of digestive tract: Secondary | ICD-10-CM | POA: Diagnosis not present

## 2021-11-30 DIAGNOSIS — Z881 Allergy status to other antibiotic agents status: Secondary | ICD-10-CM | POA: Diagnosis not present

## 2021-11-30 DIAGNOSIS — Z8719 Personal history of other diseases of the digestive system: Secondary | ICD-10-CM

## 2021-11-30 DIAGNOSIS — K660 Peritoneal adhesions (postprocedural) (postinfection): Secondary | ICD-10-CM | POA: Diagnosis present

## 2021-11-30 HISTORY — PX: ILEO LOOP COLOSTOMY CLOSURE: SHX5257

## 2021-11-30 LAB — TYPE AND SCREEN
ABO/RH(D): A POS
Antibody Screen: NEGATIVE

## 2021-11-30 LAB — GLUCOSE, CAPILLARY: Glucose-Capillary: 88 mg/dL (ref 70–99)

## 2021-11-30 SURGERY — CLOSURE, ILEOSTOMY, LAPAROSCOPIC, WITH LAPAROTOMY IF INDICATED
Anesthesia: General

## 2021-11-30 MED ORDER — ONDANSETRON HCL 4 MG/2ML IJ SOLN: 4.0000 mg | Freq: Four times a day (QID) | INTRAMUSCULAR | Status: DC | PRN

## 2021-11-30 MED ORDER — MIDAZOLAM HCL 2 MG/2ML IJ SOLN
INTRAMUSCULAR | Status: AC
  Filled 2021-11-30: qty 2

## 2021-11-30 MED ORDER — FLUOXETINE HCL 20 MG PO CAPS
20.0000 mg | ORAL_CAPSULE | Freq: Every evening | ORAL | Status: DC
  Administered 2021-11-30 – 2021-12-01 (×2): 20 mg via ORAL
  Filled 2021-11-30 (×2): qty 1

## 2021-11-30 MED ORDER — KCL IN DEXTROSE-NACL 20-5-0.45 MEQ/L-%-% IV SOLN
INTRAVENOUS | Status: DC
  Filled 2021-11-30 (×2): qty 1000

## 2021-11-30 MED ORDER — ACETAMINOPHEN 325 MG PO TABS
650.0000 mg | ORAL_TABLET | Freq: Four times a day (QID) | ORAL | Status: DC | PRN
  Administered 2021-12-01 – 2021-12-02 (×3): 650 mg via ORAL
  Filled 2021-11-30 (×3): qty 2

## 2021-11-30 MED ORDER — ONDANSETRON HCL 4 MG/2ML IJ SOLN
INTRAMUSCULAR | Status: AC
  Filled 2021-11-30: qty 2

## 2021-11-30 MED ORDER — LIDOCAINE 2% (20 MG/ML) 5 ML SYRINGE
INTRAMUSCULAR | Status: DC | PRN
  Administered 2021-11-30: 1.5 mg/kg/h via INTRAVENOUS
  Administered 2021-11-30: 80 mg via INTRAVENOUS

## 2021-11-30 MED ORDER — PROPOFOL 10 MG/ML IV BOLUS
INTRAVENOUS | Status: DC | PRN
  Administered 2021-11-30: 200 mg via INTRAVENOUS

## 2021-11-30 MED ORDER — EPHEDRINE SULFATE-NACL 50-0.9 MG/10ML-% IV SOSY
PREFILLED_SYRINGE | INTRAVENOUS | Status: DC | PRN
  Administered 2021-11-30: 5 mg via INTRAVENOUS

## 2021-11-30 MED ORDER — HYDROMORPHONE HCL 1 MG/ML IJ SOLN
INTRAMUSCULAR | Status: AC
  Filled 2021-11-30: qty 1

## 2021-11-30 MED ORDER — SODIUM CHLORIDE 0.9 % IV SOLN
2.0000 g | INTRAVENOUS | Status: AC
  Administered 2021-11-30: 2 g via INTRAVENOUS
  Filled 2021-11-30: qty 2

## 2021-11-30 MED ORDER — FENTANYL CITRATE (PF) 100 MCG/2ML IJ SOLN
INTRAMUSCULAR | Status: DC | PRN
  Administered 2021-11-30 (×6): 50 ug via INTRAVENOUS

## 2021-11-30 MED ORDER — HYDROMORPHONE HCL 1 MG/ML IJ SOLN: 0.5000 mg | INTRAMUSCULAR | Status: DC | PRN

## 2021-11-30 MED ORDER — DEXAMETHASONE SODIUM PHOSPHATE 10 MG/ML IJ SOLN
INTRAMUSCULAR | Status: DC | PRN
Start: 2021-11-30 — End: 2021-11-30
  Administered 2021-11-30: 8 mg via INTRAVENOUS

## 2021-11-30 MED ORDER — BUPIVACAINE LIPOSOME 1.3 % IJ SUSP
INTRAMUSCULAR | Status: AC
  Filled 2021-11-30: qty 20

## 2021-11-30 MED ORDER — ALVIMOPAN 12 MG PO CAPS
12.0000 mg | ORAL_CAPSULE | Freq: Two times a day (BID) | ORAL | Status: DC
  Administered 2021-12-01: 12 mg via ORAL
  Filled 2021-11-30: qty 1

## 2021-11-30 MED ORDER — ENSURE SURGERY PO LIQD
237.0000 mL | Freq: Two times a day (BID) | ORAL | Status: DC
  Administered 2021-12-01 (×2): 237 mL via ORAL

## 2021-11-30 MED ORDER — PROPOFOL 10 MG/ML IV BOLUS
INTRAVENOUS | Status: AC
  Filled 2021-11-30: qty 20

## 2021-11-30 MED ORDER — IBUPROFEN 400 MG PO TABS: 600.0000 mg | ORAL_TABLET | Freq: Four times a day (QID) | ORAL | Status: DC | PRN

## 2021-11-30 MED ORDER — BUPIVACAINE LIPOSOME 1.3 % IJ SUSP
INTRAMUSCULAR | Status: DC | PRN
  Administered 2021-11-30: 20 mL

## 2021-11-30 MED ORDER — PROMETHAZINE HCL 25 MG/ML IJ SOLN: 6.2500 mg | INTRAMUSCULAR | Status: DC | PRN

## 2021-11-30 MED ORDER — GABAPENTIN 300 MG PO CAPS
300.0000 mg | ORAL_CAPSULE | ORAL | Status: AC
  Administered 2021-11-30: 300 mg via ORAL
  Filled 2021-11-30: qty 1

## 2021-11-30 MED ORDER — 0.9 % SODIUM CHLORIDE (POUR BTL) OPTIME
TOPICAL | Status: DC | PRN
  Administered 2021-11-30: 1000 mL

## 2021-11-30 MED ORDER — ENSURE PRE-SURGERY PO LIQD
296.0000 mL | Freq: Once | ORAL | Status: DC
  Filled 2021-11-30: qty 296

## 2021-11-30 MED ORDER — ROCURONIUM BROMIDE 10 MG/ML (PF) SYRINGE
PREFILLED_SYRINGE | INTRAVENOUS | Status: AC
  Filled 2021-11-30: qty 10

## 2021-11-30 MED ORDER — FENTANYL CITRATE (PF) 100 MCG/2ML IJ SOLN
INTRAMUSCULAR | Status: AC
  Filled 2021-11-30: qty 2

## 2021-11-30 MED ORDER — LACTATED RINGERS IV SOLN: INTRAVENOUS | Status: DC

## 2021-11-30 MED ORDER — OXYCODONE HCL 5 MG/5ML PO SOLN: 5.0000 mg | Freq: Once | ORAL | Status: DC | PRN

## 2021-11-30 MED ORDER — ORAL CARE MOUTH RINSE: 15.0000 mL | Freq: Once | OROMUCOSAL | Status: AC

## 2021-11-30 MED ORDER — BUPIVACAINE-EPINEPHRINE (PF) 0.25% -1:200000 IJ SOLN
INTRAMUSCULAR | Status: AC
  Filled 2021-11-30: qty 30

## 2021-11-30 MED ORDER — ONDANSETRON HCL 4 MG/2ML IJ SOLN
INTRAMUSCULAR | Status: DC | PRN
  Administered 2021-11-30: 4 mg via INTRAVENOUS

## 2021-11-30 MED ORDER — DEXAMETHASONE SODIUM PHOSPHATE 10 MG/ML IJ SOLN
INTRAMUSCULAR | Status: AC
  Filled 2021-11-30: qty 1

## 2021-11-30 MED ORDER — BUPIVACAINE-EPINEPHRINE 0.25% -1:200000 IJ SOLN
INTRAMUSCULAR | Status: DC | PRN
  Administered 2021-11-30: 30 mL

## 2021-11-30 MED ORDER — ALVIMOPAN 12 MG PO CAPS
12.0000 mg | ORAL_CAPSULE | ORAL | Status: AC
  Administered 2021-11-30: 12 mg via ORAL
  Filled 2021-11-30: qty 1

## 2021-11-30 MED ORDER — HYDROMORPHONE HCL 1 MG/ML IJ SOLN
0.2500 mg | INTRAMUSCULAR | Status: DC | PRN
  Administered 2021-11-30 (×4): 0.5 mg via INTRAVENOUS

## 2021-11-30 MED ORDER — ROCURONIUM BROMIDE 10 MG/ML (PF) SYRINGE
PREFILLED_SYRINGE | INTRAVENOUS | Status: DC | PRN
  Administered 2021-11-30: 90 mg via INTRAVENOUS
  Administered 2021-11-30: 10 mg via INTRAVENOUS

## 2021-11-30 MED ORDER — LIDOCAINE HCL (PF) 2 % IJ SOLN
INTRAMUSCULAR | Status: AC
  Filled 2021-11-30: qty 5

## 2021-11-30 MED ORDER — ONDANSETRON HCL 4 MG PO TABS: 4.0000 mg | ORAL_TABLET | Freq: Four times a day (QID) | ORAL | Status: DC | PRN

## 2021-11-30 MED ORDER — MIDAZOLAM HCL 5 MG/5ML IJ SOLN
INTRAMUSCULAR | Status: DC | PRN
  Administered 2021-11-30: 2 mg via INTRAVENOUS

## 2021-11-30 MED ORDER — SIMETHICONE 80 MG PO CHEW: 40.0000 mg | CHEWABLE_TABLET | Freq: Four times a day (QID) | ORAL | Status: DC | PRN

## 2021-11-30 MED ORDER — ALUM & MAG HYDROXIDE-SIMETH 200-200-20 MG/5ML PO SUSP: 30.0000 mL | Freq: Four times a day (QID) | ORAL | Status: DC | PRN

## 2021-11-30 MED ORDER — DROPERIDOL 2.5 MG/ML IJ SOLN: 0.6250 mg | Freq: Once | INTRAMUSCULAR | Status: DC | PRN

## 2021-11-30 MED ORDER — LIDOCAINE HCL (PF) 2 % IJ SOLN
INTRAMUSCULAR | Status: AC
  Filled 2021-11-30: qty 10

## 2021-11-30 MED ORDER — OXYCODONE HCL 5 MG PO TABS
ORAL_TABLET | ORAL | Status: AC
  Filled 2021-11-30: qty 1

## 2021-11-30 MED ORDER — ENOXAPARIN SODIUM 40 MG/0.4ML IJ SOSY
40.0000 mg | PREFILLED_SYRINGE | INTRAMUSCULAR | Status: DC
  Administered 2021-12-01 – 2021-12-02 (×2): 40 mg via SUBCUTANEOUS
  Filled 2021-11-30 (×2): qty 0.4

## 2021-11-30 MED ORDER — ACETAMINOPHEN 500 MG PO TABS
1000.0000 mg | ORAL_TABLET | ORAL | Status: AC
  Administered 2021-11-30: 1000 mg via ORAL
  Filled 2021-11-30: qty 2

## 2021-11-30 MED ORDER — SACCHAROMYCES BOULARDII 250 MG PO CAPS
250.0000 mg | ORAL_CAPSULE | Freq: Two times a day (BID) | ORAL | Status: DC
  Administered 2021-11-30 – 2021-12-02 (×4): 250 mg via ORAL
  Filled 2021-11-30 (×4): qty 1

## 2021-11-30 MED ORDER — BUPIVACAINE LIPOSOME 1.3 % IJ SUSP: 20.0000 mL | Freq: Once | INTRAMUSCULAR | Status: DC

## 2021-11-30 MED ORDER — ENSURE PRE-SURGERY PO LIQD
592.0000 mL | Freq: Once | ORAL | Status: DC
  Filled 2021-11-30: qty 592

## 2021-11-30 MED ORDER — GABAPENTIN 300 MG PO CAPS
300.0000 mg | ORAL_CAPSULE | Freq: Two times a day (BID) | ORAL | Status: DC
  Administered 2021-11-30 – 2021-12-02 (×4): 300 mg via ORAL
  Filled 2021-11-30 (×4): qty 1

## 2021-11-30 MED ORDER — OXYCODONE HCL 5 MG PO TABS
5.0000 mg | ORAL_TABLET | ORAL | Status: DC | PRN
  Administered 2021-11-30 (×2): 5 mg via ORAL
  Administered 2021-12-01: 10 mg via ORAL
  Administered 2021-12-01: 5 mg via ORAL
  Administered 2021-12-01: 10 mg via ORAL
  Administered 2021-12-01: 5 mg via ORAL
  Administered 2021-12-01: 10 mg via ORAL
  Administered 2021-12-02: 5 mg via ORAL
  Filled 2021-11-30: qty 2
  Filled 2021-11-30: qty 1
  Filled 2021-11-30 (×3): qty 2
  Filled 2021-11-30 (×2): qty 1

## 2021-11-30 MED ORDER — CHLORHEXIDINE GLUCONATE 0.12 % MT SOLN
15.0000 mL | Freq: Once | OROMUCOSAL | Status: AC
  Administered 2021-11-30: 15 mL via OROMUCOSAL

## 2021-11-30 MED ORDER — SODIUM CHLORIDE 0.9 % IV SOLN
2.0000 g | Freq: Two times a day (BID) | INTRAVENOUS | Status: AC
  Filled 2021-11-30: qty 2

## 2021-11-30 MED ORDER — OXYCODONE HCL 5 MG PO TABS: 5.0000 mg | ORAL_TABLET | Freq: Once | ORAL | Status: DC | PRN

## 2021-11-30 SURGICAL SUPPLY — 47 items
APPLIER CLIP 5 13 M/L LIGAMAX5 (MISCELLANEOUS)
BAG COUNTER SPONGE SURGICOUNT (BAG) IMPLANT
CELLS DAT CNTRL 66122 CELL SVR (MISCELLANEOUS) IMPLANT
CLIP APPLIE 5 13 M/L LIGAMAX5 (MISCELLANEOUS) IMPLANT
COVER MAYO STAND STRL (DRAPES) ×2 IMPLANT
DRAIN CHANNEL 19F RND (DRAIN) IMPLANT
DRSG PAD ABDOMINAL 8X10 ST (GAUZE/BANDAGES/DRESSINGS) ×2 IMPLANT
DRSG TEGADERM 4X4.75 (GAUZE/BANDAGES/DRESSINGS) IMPLANT
DRSG TELFA 3X8 NADH (GAUZE/BANDAGES/DRESSINGS) ×2 IMPLANT
ELECT REM PT RETURN 15FT ADLT (MISCELLANEOUS) ×2 IMPLANT
EVACUATOR SILICONE 100CC (DRAIN) IMPLANT
GAUZE 4X4 16PLY ~~LOC~~+RFID DBL (SPONGE) ×2 IMPLANT
GAUZE SPONGE 4X4 12PLY STRL (GAUZE/BANDAGES/DRESSINGS) IMPLANT
GLOVE SURG ENC MOIS LTX SZ6.5 (GLOVE) ×4 IMPLANT
GLOVE SURG UNDER LTX SZ6.5 (GLOVE) ×2 IMPLANT
GLOVE SURG UNDER POLY LF SZ7 (GLOVE) ×2 IMPLANT
KIT TURNOVER KIT A (KITS) ×2 IMPLANT
PACK GENERAL/GYN (CUSTOM PROCEDURE TRAY) ×2 IMPLANT
RELOAD PROXIMATE 75MM BLUE (ENDOMECHANICALS) ×2 IMPLANT
RTRCTR WOUND ALEXIS 18CM MED (MISCELLANEOUS)
SEALER TISSUE G2 STRG ARTC 35C (ENDOMECHANICALS) IMPLANT
SPONGE T-LAP 18X18 ~~LOC~~+RFID (SPONGE) ×4 IMPLANT
STAPLER GUN LINEAR PROX 60 (STAPLE) ×2 IMPLANT
STAPLER PROXIMATE 75MM BLUE (STAPLE) ×2 IMPLANT
STAPLER VISISTAT 35W (STAPLE) IMPLANT
SUT ETHILON 2 0 PS N (SUTURE) IMPLANT
SUT NOVA NAB DX-16 0-1 5-0 T12 (SUTURE) IMPLANT
SUT NOVA NAB GS-21 1 T12 (SUTURE) ×12 IMPLANT
SUT PDS AB 1 CTX 36 (SUTURE) IMPLANT
SUT PDS AB 1 TP1 96 (SUTURE) IMPLANT
SUT PROLENE 2 0 KS (SUTURE) IMPLANT
SUT SILK 2 0 (SUTURE) ×1
SUT SILK 2 0 SH CR/8 (SUTURE) IMPLANT
SUT SILK 2-0 18XBRD TIE 12 (SUTURE) ×1 IMPLANT
SUT SILK 3 0 (SUTURE)
SUT SILK 3 0 SH CR/8 (SUTURE) ×4 IMPLANT
SUT SILK 3-0 18XBRD TIE 12 (SUTURE) IMPLANT
SUT VIC AB 2-0 SH 18 (SUTURE) ×2 IMPLANT
SUT VICRYL 0 UR6 27IN ABS (SUTURE) ×2 IMPLANT
SYS LAPSCP GELPORT 120MM (MISCELLANEOUS)
SYS WOUND ALEXIS 18CM MED (MISCELLANEOUS)
SYSTEM LAPSCP GELPORT 120MM (MISCELLANEOUS) IMPLANT
SYSTEM WOUND ALEXIS 18CM MED (MISCELLANEOUS) IMPLANT
TAPE CLOTH SURG 6X10 WHT LF (GAUZE/BANDAGES/DRESSINGS) ×2 IMPLANT
TOWEL OR 17X26 10 PK STRL BLUE (TOWEL DISPOSABLE) ×2 IMPLANT
TOWEL OR NON WOVEN STRL DISP B (DISPOSABLE) ×2 IMPLANT
TRAY FOLEY MTR SLVR 16FR STAT (SET/KITS/TRAYS/PACK) ×2 IMPLANT

## 2021-11-30 NOTE — Op Note (Signed)
11/30/2021  9:29 AM  PATIENT:  Brian Blackburn  45 y.o. male  Patient Care Team: Medicine, [facility] as PCP - General (Family Medicine)  PRE-OPERATIVE DIAGNOSIS:  J pouch, diverting ileostomy  POST-OPERATIVE DIAGNOSIS:  J pouch, diverting ileostomy  PROCEDURE: ILEOSTOMY REVERSAL, RECTAL DILATION    Surgeon(s): Romie Levee, MD Karie Soda, MD  ASSISTANT: Dr Michaell Cowing   ANESTHESIA:   local and general  EBL: 75ml Total I/O In: 1100 [I.V.:1000; IV Piggyback:100] Out: 110 [Urine:100; Blood:10]  DRAINS: none   SPECIMEN:  Source of Specimen:  ileostomy  DISPOSITION OF SPECIMEN:  PATHOLOGY  COUNTS:  YES  PLAN OF CARE: Admit to inpatient   PATIENT DISPOSITION:  PACU - hemodynamically stable.  INDICATION: 45 y.o. M with diverting ileostomy for the purpose of healing his ileoanal j pouch anastomosis   OR FINDINGS: rectal stricture, dilated digitally  DESCRIPTION: the patient was identified in the preoperative holding area and taken to the OR where they were laid supine on the operating room table.  General anesthesia was induced without difficulty. SCDs were also noted to be in place prior to the initiation of anesthesia.  The patient was then prepped and draped in the usual sterile fashion.   A surgical timeout was performed indicating the correct patient, procedure, positioning and need for preoperative antibiotics.   I began by making an incision around the mucocutaneous junction using electrocautery.  Dissection was then carried down through the subcutaneous tissues using electrocautery.  The ileostomy was densely adherent to the abdominal fascia.  This was taking down using blunt dissection and Metzenbaum scissors to divide the adhesions.  Hemostasis was obtained as we went using the electrocautery and 3-0 silk suture ligatures.  I continued until we were able to free up enough adhesions to safely enter the abdomen.  I then swept all adhesions away from the  abdominal wall and continued to free the ileostomy from the abdominal wall fascia using the Metzenbaum scissors.  Once this was complete the ileostomy limbs were divided using 275 mm blue load GIA staplers.  We then created an anastomosis between the 2 limbs of bowel using another 75 mm blue load stapler.  After this the common enterotomy was closed using a 60 mm TA blue load stapler.  Upon inspection afterwards it appeared that only half of the staple line fired with the stapler.  I decided to close the remaining opening using interrupted 3-0 silk sutures in a Cannel fashion.  This closed the remaining portion of the enterotomy.  I checked for leaks using a hemostat.  There was no openings remaining.  This was then placed back into the abdomen after the mesenteric defect was closed using interrupted 3-0 silk sutures.  The fascia was then mobilized from the subcutaneous tissues using electrocautery.  The fascia was then closed using #1 Novafil interrupted sutures.  The subcutaneous tissue was reapproximated over this using a pursestring 2-0 Vicryl suture.  A Telfa wick was placed in the middle of the wound and the dermal layer was partially closed using another 2-0 Vicryl pursestring suture.  A sterile dressing was applied.  The patient was then awakened from anesthesia and sent to the postanesthesia care unit in stable condition.  All counts were correct per operating room staff.

## 2021-11-30 NOTE — Anesthesia Procedure Notes (Signed)
Procedure Name: Intubation Date/Time: 11/30/2021 7:38 AM Performed by: Victoriano Lain, CRNA Pre-anesthesia Checklist: Patient identified, Emergency Drugs available, Suction available, Patient being monitored and Timeout performed Patient Re-evaluated:Patient Re-evaluated prior to induction Oxygen Delivery Method: Circle system utilized Preoxygenation: Pre-oxygenation with 100% oxygen Induction Type: IV induction Ventilation: Mask ventilation without difficulty and Oral airway inserted - appropriate to patient size Laryngoscope Size: Mac and 4 Grade View: Grade I Tube type: Oral Tube size: 7.5 mm Number of attempts: 1 Airway Equipment and Method: Stylet Placement Confirmation: ETT inserted through vocal cords under direct vision, positive ETCO2 and breath sounds checked- equal and bilateral Secured at: 22 cm Tube secured with: Tape Dental Injury: Teeth and Oropharynx as per pre-operative assessment

## 2021-11-30 NOTE — Transfer of Care (Signed)
Immediate Anesthesia Transfer of Care Note  Patient: Brian Blackburn  Procedure(s) Performed: ILEOSTOMY REVERSAL, RECTAL DILATION  Patient Location: PACU  Anesthesia Type:General  Level of Consciousness: awake, alert , oriented and patient cooperative  Airway & Oxygen Therapy: Patient Spontanous Breathing and Patient connected to face mask oxygen  Post-op Assessment: Report given to RN, Post -op Vital signs reviewed and stable and Patient moving all extremities  Post vital signs: Reviewed and stable  Last Vitals:  Vitals Value Taken Time  BP 145/93 11/30/21 0934  Temp    Pulse 87 11/30/21 0936  Resp 16 11/30/21 0936  SpO2 100 % 11/30/21 0936  Vitals shown include unvalidated device data.  Last Pain:  Vitals:   11/30/21 0622  TempSrc:   PainSc: 0-No pain         Complications: No notable events documented.

## 2021-11-30 NOTE — H&P (Signed)
  The patient is a 45 year old male presenting for a post-operative concern. 45 year old male who presents to the office today for evaluation of ileostomy reversal.  He has a history of Crohn's disease diagnosed in 2007.  His colonoscopy in 2018 showed pancolitis.  He has been managed on 6 MP, steroids and Humira.  He did well on Humira.  Unfortunately, he developed a perforation of his colon during surveillance colonoscopy in July 2021.  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  He was hospitalized for approximately 9 days and then rehospitalized approximately 1 week later with an intra-abdominal abscess requiring IV antibiotics and CT-guided drain with another 5 day hospital course.  He has since underwent proctectomy and loop ileostomy.  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 Medications Reconciled    Social History  Alcohol use  Occasional alcohol use. Caffeine use  Coffee. No drug use  Tobacco use  Former smoker.   Family History  First Degree Relatives  No pertinent family history    Other Problems Crohn's Disease  Ulcerative Colitis          Review of Systems  General Not Present- Appetite Loss, Chills, Fatigue, Fever, Night Sweats, Weight Gain and Weight Loss. Skin Not Present- Change in Wart/Mole, Dryness, Hives, Jaundice, New Lesions, Non-Healing Wounds, Rash and Ulcer. HEENT Not Present- Earache, Hearing Loss, Hoarseness, Nose Bleed, Oral Ulcers, Ringing in the Ears, Seasonal Allergies, Sinus Pain, Sore Throat, Visual Disturbances, Wears glasses/contact lenses and  Yellow Eyes. Respiratory Not Present- Bloody sputum, Chronic Cough, Difficulty Breathing, Snoring and Wheezing. Breast Not Present- Breast Mass, Breast Pain, Nipple Discharge and Skin Changes. Cardiovascular Not Present- Chest Pain, Difficulty Breathing Lying Down, Leg Cramps, Palpitations, Rapid Heart Rate, Shortness of Breath and Swelling of Extremities. Gastrointestinal Not Present- Abdominal Pain, Bloating, Bloody Stool, Change in Bowel Habits, Chronic diarrhea, Constipation, Difficulty Swallowing, Excessive gas, Gets full quickly at meals, Hemorrhoids, Indigestion, Nausea, Rectal Pain and Vomiting. Male Genitourinary Not Present- Blood in Urine, Change in Urinary Stream, Frequency, Impotence, Nocturia, Painful Urination, Urgency and Urine Leakage.    BP 136/78   Pulse 80   Temp 98.1 F (36.7 C) (Oral)   Resp 16   Ht 6\' 1"  (1.854 m)   Wt 106.6 kg   SpO2 100%   BMI 31.00 kg/m    Physical Exam   General Mental Status - Alert. General Appearance - Cooperative.  CV: RRR Lungs: CTA Abdomen Inspection Skin - Skin - Scar - Note: Large abdominal scar throughout the entire abdomen, well-healed. Ileostomy - Right Lower Quadrant. Palpation/Percussion Palpation and Percussion of the abdomen reveal - Soft and Non Tender.     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 Flexible proctoscopy shows continued active inflammation consistent with ulcerative colitis on biopsy. I have performed proctectomy and J-pouch. Pouchogram and pouchoscopy appear without leak and healed.  I have recommended ileostomy reversal.  Risks include bleeding, infection, hernia and leak of surgical connections.  All questions were answered.

## 2021-11-30 NOTE — Anesthesia Postprocedure Evaluation (Signed)
Anesthesia Post Note  Patient: Brian Blackburn  Procedure(s) Performed: ILEOSTOMY REVERSAL, RECTAL DILATION     Patient location during evaluation: PACU Anesthesia Type: General Level of consciousness: sedated Pain management: pain level controlled Vital Signs Assessment: post-procedure vital signs reviewed and stable Respiratory status: spontaneous breathing and respiratory function stable Cardiovascular status: stable Postop Assessment: no apparent nausea or vomiting Anesthetic complications: no   No notable events documented.  Last Vitals:  Vitals:   11/30/21 1015 11/30/21 1030  BP: (!) 139/92 (!) 142/91  Pulse: 89 76  Resp: 13 16  Temp:  36.6 C  SpO2: 96% 94%    Last Pain:  Vitals:   11/30/21 1030  TempSrc:   PainSc: Asleep                 Mellody Dance

## 2021-12-01 ENCOUNTER — Encounter (HOSPITAL_COMMUNITY): Payer: Self-pay | Admitting: General Surgery

## 2021-12-01 LAB — CBC
HCT: 40.3 % (ref 39.0–52.0)
Hemoglobin: 13.9 g/dL (ref 13.0–17.0)
MCH: 30.8 pg (ref 26.0–34.0)
MCHC: 34.5 g/dL (ref 30.0–36.0)
MCV: 89.2 fL (ref 80.0–100.0)
Platelets: 210 K/uL (ref 150–400)
RBC: 4.52 MIL/uL (ref 4.22–5.81)
RDW: 13.2 % (ref 11.5–15.5)
WBC: 13.4 K/uL — ABNORMAL HIGH (ref 4.0–10.5)
nRBC: 0 % (ref 0.0–0.2)

## 2021-12-01 LAB — BASIC METABOLIC PANEL WITH GFR
Anion gap: 8 (ref 5–15)
BUN: 11 mg/dL (ref 6–20)
CO2: 27 mmol/L (ref 22–32)
Calcium: 9.2 mg/dL (ref 8.9–10.3)
Chloride: 103 mmol/L (ref 98–111)
Creatinine, Ser: 0.89 mg/dL (ref 0.61–1.24)
GFR, Estimated: >60 mL/min
Glucose, Bld: 125 mg/dL — ABNORMAL HIGH (ref 70–99)
Potassium: 4.5 mmol/L (ref 3.5–5.1)
Sodium: 138 mmol/L (ref 135–145)

## 2021-12-01 LAB — SURGICAL PATHOLOGY

## 2021-12-01 MED ORDER — DIPHENOXYLATE-ATROPINE 2.5-0.025 MG PO TABS: 1.0000 | ORAL_TABLET | Freq: Four times a day (QID) | ORAL | 2 refills | Status: DC | PRN

## 2021-12-01 MED ORDER — TRAMADOL HCL 50 MG PO TABS
50.0000 mg | ORAL_TABLET | Freq: Four times a day (QID) | ORAL | Status: DC | PRN
Start: 2021-12-01 — End: 2021-12-02

## 2021-12-01 MED ORDER — CALCIUM POLYCARBOPHIL 625 MG PO TABS: 2200.0000 mg | ORAL_TABLET | Freq: Three times a day (TID) | ORAL | Status: DC

## 2021-12-01 MED ORDER — LOPERAMIDE HCL 2 MG PO CAPS
4.0000 mg | ORAL_CAPSULE | Freq: Four times a day (QID) | ORAL | 3 refills | Status: DC | PRN
Start: 2021-12-01 — End: 2023-01-21

## 2021-12-01 NOTE — Progress Notes (Signed)
Transition of Care [facility]) Screening Note  Patient Details  Name: Brian Blackburn Date of Birth: Feb 01, 1976  Transition of Care [facility]) CM/SW Contact:    Ewing Schlein, LCSW Phone Number: 12/01/2021, 11:43 AM  Transition of Care Department [facility]) has reviewed patient and no TOC needs have been identified at this time. We will continue to monitor patient advancement through interdisciplinary progression rounds. If new patient transition needs arise, please place a TOC consult.

## 2021-12-01 NOTE — Progress Notes (Signed)
1 Day Post-Op ileostomy reversal Subjective: Had some pain and bloating overnight.  Better this AM.  Having BM's and tolerating fulls  Objective: Vital signs in last 24 hours: Temp:  [97.7 F (36.5 C)-98.6 F (37 C)] 98 F (36.7 C) (12/09 0956) Pulse Rate:  [54-97] 79 (12/09 0956) Resp:  [12-18] 18 (12/09 0956) BP: (118-153)/(82-102) 119/83 (12/09 0956) SpO2:  [96 %-100 %] 98 % (12/09 0956)   Intake/Output from previous day: 12/08 0701 - 12/09 0700 In: 2686.5 [P.O.:610; I.V.:1976.5; IV Piggyback:100] Out: 810 [Urine:800; Blood:10] Intake/Output this shift: Total I/O In: 120 [P.O.:120] Out: -    General appearance: alert and cooperative  Incision: no significant drainage  Lab Results:  Recent Labs    12/01/21 0505  WBC 13.4*  HGB 13.9  HCT 40.3  PLT 210   BMET Recent Labs    12/01/21 0505  NA 138  K 4.5  CL 103  CO2 27  GLUCOSE 125*  BUN 11  CREATININE 0.89  CALCIUM 9.2   PT/INR No results for input(s): LABPROT, INR in the last 72 hours. ABG No results for input(s): PHART, HCO3 in the last 72 hours.  Invalid input(s): PCO2, PO2  MEDS, Scheduled  alvimopan  12 mg Oral BID   enoxaparin (LOVENOX) injection  40 mg Subcutaneous Q24H   feeding supplement  237 mL Oral BID BM   FLUoxetine  20 mg Oral QPM   gabapentin  300 mg Oral BID   saccharomyces boulardii  250 mg Oral BID    Studies/Results: No results found.  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

## 2021-12-01 NOTE — Discharge Instructions (Signed)
ABDOMINAL SURGERY: POST OP INSTRUCTIONS  DIET: Follow a light bland diet the first 24 hours after arrival home, such as soup, liquids, crackers, etc.  Be sure to include lots of fluids daily.  Avoid fast food or heavy meals as your are more likely to get nauseated.  Eat a low fat the next few days after surgery.  See below for further instructions Take your usually prescribed home medications unless otherwise directed. PAIN CONTROL: Pain is best controlled by a usual combination of three different methods TOGETHER: Ice/Heat Over the counter pain medication Prescription pain medication Most patients will experience some swelling and bruising around the incisions.  Ice packs or heating pads (30-60 minutes up to 6 times a day) will help. Use ice for the first few days to help decrease swelling and bruising, then switch to heat to help relax tight/sore spots and speed recovery.  Some people prefer to use ice alone, heat alone, alternating between ice & heat.  Experiment to what works for you.  Swelling and bruising can take several weeks to resolve.   It is helpful to take an over-the-counter pain medication regularly for the first few weeks.  Choose one of the following that works best for you: Naproxen (Aleve, etc)  Two 220mg  tabs twice a day Ibuprofen (Advil, etc) Three 200mg  tabs four times a day (every meal & bedtime) Acetaminophen (Tylenol, etc) 500-650mg  four times a day (every meal & bedtime) A  prescription for pain medication (such as oxycodone, hydrocodone, etc) should be given to you upon discharge.  Take your pain medication as prescribed.  If you are having problems/concerns with the prescription medicine (does not control pain, nausea, vomiting, rash, itching, etc), please call us [phone] to see if we need to switch you to a different pain medicine that will work better for you and/or control your side effect better. If you need a refill on your pain medication, please contact your  pharmacy.  They will contact our office to request authorization. Prescriptions will not be filled after 5 pm or on week-ends.  Watch out for diarrhea.  If you have many loose bowel movements, simplify your diet to bland foods & liquids for a few days.  Stop any stool softeners and decrease your fiber supplement.  Switching to mild anti-diarrheal medications (Kayopectate, Pepto Bismol) can help.  If this worsens or does not improve, please call us. Wash / shower every day.  You may shower over the incision / wound.  Avoid baths until the skin is fully healed.  Continue to shower over incision(s) after the dressing is off. Cover with a dry dressing and change daily.   ACTIVITIES as tolerated:   You may resume regular (light) daily activities beginning the next day--such as daily self-care, walking, climbing stairs--gradually increasing activities as tolerated.  If you can walk 30 minutes without difficulty, it is safe to try more intense activity such as jogging, treadmill, bicycling, low-impact aerobics, swimming, etc. Save the most intensive and strenuous activity for last such as sit-ups, heavy lifting, contact sports, etc  Refrain from any heavy lifting or straining until you are off narcotics for pain control.   DO NOT PUSH THROUGH PAIN.  Let pain be your guide: If it hurts to do something, don't do it.  Pain is your body warning you to avoid that activity for another week until the pain goes down. You may drive when you are no longer taking prescription pain medication, you can comfortably wear a seatbelt,  and you can safely maneuver your car and apply brakes. You may have sexual intercourse when it is comfortable.  FOLLOW UP in our office Please call [facility] at [phone] to set up an appointment to see your surgeon in the office for a follow-up appointment approximately 1-2 weeks after your surgery. Make sure that you call for this appointment the day you arrive home to insure a convenient  appointment time. 10. IF YOU HAVE DISABILITY OR FAMILY LEAVE FORMS, BRING THEM TO THE OFFICE FOR PROCESSING.  DO NOT GIVE THEM TO YOUR DOCTOR.   WHEN TO CALL us [phone]: Poor pain control Reactions / problems with new medications (rash/itching, nausea, etc)  Fever over 101.5 F (38.5 C) Inability to urinate Nausea and/or vomiting Worsening swelling or bruising Continued bleeding from incision. Increased pain, redness, or drainage from the incision  The clinic staff is available to answer your questions during regular business hours (8:30am-5pm).  Please don't hesitate to call and ask to speak to one of our nurses for clinical concerns.   A surgeon from [facility] is always on call at the hospitals   If you have a medical emergency, go to the nearest emergency room or call 911.    [facility], [place], [place], [place], [place]  [address] ? MAIN: [phone] ? TOLL FREE: [phone] ? FAX [phone] [email or web address]     J Pouch Instructions  Diet Since many people have frequent bowel movements right after the operation, following a diet can help reduce the number of stools. Incorporate new foods into your diet one at a time to see how they affect your stool output. You may find that some foods give you trouble initially, but that you can tolerate them better later.  Do not skip meals. This tends to make the stools more irritating and loose.  Balancing starches with foods that tend to give diarrhea is also helpful in controlling the frequency of bowel movements.  Once your colon has been removed, you will need more salt until your body adjusts to not having a colon. Pretzels and corn chips are good snacks.  Hot and spicy foods will probably burn on the way out and should be avoided if your anal area feels irritated.  Seeds and nuts also can be irritating.  Within three to nine months after surgery, your body  will have started to adjust to having a J-pouch. At this point, you should try to eat all types of foods and see how they affect you. Some patients have tried a low glycemic index diet, which includes foods that are high in fiber and cause a slow rise in blood sugar, to help control their bowel movements. Resources on the Internet and at your [employer] are available for more information on this type of diet.  Fluids are important to prevent dehydration. Drink enough fluid so your urine is light yellow in color. Avoid fruit juices, carbonated beverages, drinks with caffeine and straws (swallowed air increases gas). Rather than drinking fluids with your meal, try drinking fluids at the end of your meal, which may help slow down your stool.  Many people ask about vitamin supplements after their operation when they are limiting fruits and vegetables. It is fine to take vitamins, but they should be chewable or in liquid form, otherwise they may not be fully absorbed.  Stool Frequency According to a survey of [facility] patients who have had an ileoanal reservoir procedure and  have a J-pouch, about half of the patients have between five to eight stools a day. About 30 percent have nine to 12 stools a day. Older patients -- those over 68 -- have more stools than younger patients. Less than 8 percent of patients have less than four stools a day. About 9 percent have over 13 stools a day. As the reservoir adapts and stretches to its normal capacity, the number of stools you have per day should decrease. You will probably have many stools while you are in the hospital, but these should lessen by the time you go home. Often the first stools are without your control while in the hospital, though this is resolved before you go home. The biggest increase in stools usually occurs after you begin eating. You can expect your number of bowel movements to decrease gradually. The stools will range from watery to a paste-like  consistency. By watching your diet, you can avoid foods that tend to produce loose stools. The more frequent your bowel movements, the more itching and burning you will have around your anus. A combined approach of diet and medications, such as Imodium and Lomotil, may help decrease your number of bowel movements. Most people need to take these medications more frequently right after having their ileoanal reservoir procedure, but only use them occasionally as their reservoir function improves. For some people, adding a fiber supplement, such as Metamucil, Benefiber, Konsyl, Citracel or a generic equivalent, and taking them with half the recommended amount of water, can help thicken their stool and reduce the number of bowel movements. Nighttime Stool Frequency Stool frequency at night is a common problem and can be very disruptive of a good night's sleep. More than half of [facility] patients surveyed -- about 63 percent -- get up once or twice per night to pass stool, and about 24 percent of patients awaken three times or more during the night to have a bowel movement. This can be related to eating late, overeating or eating foods known to cause problems. Tips for decreasing the number of stools at night include: Don't eat late. Wait at least three to four hours after your last meal before going to bed.  Take an anti-diarrheal medication before going to bed.  Eat binding foods at dinner and avoid those foods that tend to cause diarrhea.  Don't overeat at dinner. Diarrhea Diarrhea occurs when your stool is very watery and more frequent than usual. Diarrhea can be caused by eating certain types of foods, eating too much of any food, pouchitis and other illnesses like the flu. If you develop the flu early in your recovery and have diarrhea, you may become dehydrated and need to be hospitalized. Sometimes people have diarrhea because they don't have the enzyme called lactase needed to digest the sugar in milk  products. If you feel bloated and have cramps and gas after drinking milk, try not drinking it and see if symptoms go away. You can also try fermented milk products, such as yogurt, hard cheese and buttermilk, as well as soy or goat's milk. Adding fiber supplements, such as Metamucil, Benefiber, Konsyl, Citracel or the generic equivalent, can help thicken your stool and reduce diarrhea. Gatorade or other sport drinks can also be helpful in treating diarrhea by keeping you hydrated. Skin Care Until your body adjusts to your new internal pouch, you may experience some leakage of stool, especially at night. Liquid stool is very irritating to the skin around the anus, so it is  important to keep this area clean and dry. Use soft, white, non-perfumed toilet tissue to blot gently after each bowel movement, drying the skin completely. Do not scratch, rub or wipe the skin. It may help to spray water from a squirt bottle. Some people find drying the skin with a blow dryer helps. For irritated skin around the anus, warm baths are very soothing. Do not apply anything to the skin that burns or irritates. Moist cotton balls may be better for wiping if your skin is very sore. Some people use baby wipes to cleanse themselves. Use a brand without alcohol or scents. Some people tuck a small piece of toilet tissue or a dry cotton ball near the anal opening to protect their skin from small amounts of leakage. Scented soaps and tissues should not be used since these may cause irritation. A protective ointment may be used after each bowel movement to keep the stool off the skin. You may want to wear a protective pad to keep your clothing clean. Anal itching and burning are often not visible on the outside because the irritated area is actually on the inside. You can look with a mirror to see if your irritation is on the outside skin. Some of the itching and burning is a normal part of the internal healing and will go away in time.  Some people have found Pepto Bismol taken by mouth is helpful for the burning. Sometimes people develop a yeast infection around the anal area. It happens more commonly when people are taking antibiotics, or in women around the time of their period. If you get an itchy red rash, you may need an anti-fungal cream or ointment. This is available over the counter. Occasionally people are allergic to the preparations they are using around their anus. This is not very common. Some products contain lanolin -- if you are allergic to this, check the ingredients of ointments before using. Incontinence Mild incontinence or leakage of stool from your J-pouch is a common problem that improves with time as your stool thickens, your pouch stretches and your sphincters become stronger. The incontinence is usually worse at night when your sphincters relax. The looser the stool, the more likely it is to leak. Some people accidentally pass stool when they are passing gas. In time, most people develop an awareness of whether they are passing stool or gas, but initially this can be a problem. It is also important to note that if you develop incontinence later on, you may have pouchitis. According to our survey, 60 percent of people occasionally pass stool without control, usually at night while in a deep sleep. About 8 percent occasionally pass some liquid stool during the day. About 35 percent notice staining of stool on their underwear at least once a month during the day or the night. Often this can be related to eating something that causes a problem, having pouchitis, overeating, going to bed soon after eating, or just being in a very deep sleep. Medications, diet and careful skin care are all important during this period of adaptation. A small pad helps absorb leakage. Some people find sleeping on a small towel or pad also is helpful. Before your operation, it is a good idea to buy more cotton underwear.

## 2021-12-02 ENCOUNTER — Other Ambulatory Visit (HOSPITAL_COMMUNITY): Payer: Self-pay

## 2021-12-02 MED ORDER — OXYCODONE HCL 5 MG PO TABS: 5.0000 mg | ORAL_TABLET | Freq: Four times a day (QID) | ORAL | 0 refills | Status: DC | PRN

## 2021-12-02 NOTE — Progress Notes (Signed)
Reviewed written d/c instructions w pt and all questions answered. He verbalized understanding. D/ C per w/c w all belongings in stable condition. 

## 2021-12-02 NOTE — Discharge Summary (Signed)
Physician Discharge Summary  Patient ID: Brian Blackburn MRN: [id number] DOB/AGE: 08/30/1976 45 y.o.  Admit date: 11/30/2021 Discharge date: 12/02/2021  Admission Diagnoses: ileostomy  Discharge Diagnoses:  Principal Problem:   Ileostomy in place [facility])   Discharged Condition: good  Hospital Course: Underwent planned ileostomy reversal. Recovered well. On POD 2 was tolerating a diet, pain was well controlled, walking without assistance and having bowel function.   Consults: None  Discharge Exam: Blood pressure 109/70, pulse 60, temperature 98 F (36.7 C), temperature source Oral, resp. rate 18, height 6\' 1"  (1.854 m), weight 106.6 kg, SpO2 96 %. Alert, well appearing Unlabored respirations Abdomen soft, minimally tender, incision c/d without cellulitis or drainage. Wick removed.   Disposition: Discharge disposition: 01-Home or Self Care        Allergies as of 12/02/2021       Reactions   Azithromycin Rash   Doxycycline Rash        Medication List     TAKE these medications    atorvastatin 20 MG tablet Commonly known as: LIPITOR Take 20 mg by mouth at bedtime.   diphenoxylate-atropine 2.5-0.025 MG tablet Commonly known as: LOMOTIL Take 1 tablet by mouth 4 (four) times daily as needed for diarrhea or loose stools. What changed:  when to take this reasons to take this   FLUoxetine 20 MG capsule Commonly known as: PROZAC Take 20 mg by mouth every evening.   loperamide 2 MG capsule Commonly known as: IMODIUM Take 2 capsules (4 mg total) by mouth 4 (four) times daily as needed for diarrhea or loose stools. What changed:  when to take this reasons to take this   oxyCODONE 5 MG immediate release tablet Commonly known as: Oxy IR/ROXICODONE Take 1 tablet (5 mg total) by mouth every 6 (six) hours as needed for severe pain (pain not relieved by tylenol, ibuprofen, rest or ice).   polycarbophil 625 MG tablet Commonly known as: FIBERCON Take 3.5 tablets  (2,200 mg total) by mouth 3 (three) times daily. 2.2 grams x 3 What changed:  how much to take additional instructions   Vitamin D3 125 MCG (5000 UT) Tabs Take 10,000 Units by mouth daily.        Follow-up Information     03-12-1990, MD. Schedule an appointment as soon as possible for a visit in 2 week(s).   Specialties: General Surgery, Colon and Rectal Surgery Contact information: [address] [place] [phone]                 Signed: [phone] 12/02/2021, 8:45 AM

## 2021-12-04 ENCOUNTER — Other Ambulatory Visit (HOSPITAL_COMMUNITY): Payer: Self-pay

## 2022-07-28 IMAGING — RF DG BE SINGLE CONTRAST
1 series · 14 of 24 positions shown · non-contrast
Comparison: None.

CLINICAL DATA: History of ulcerative colitis. Status post lower
anterior resection with J pouch creation and loop ileostomy.

EXAM:
WATER SOLUBLE CONTRAST ENEMA
TECHNIQUE: A flexible Foley catheter was carefully placed within the rectum and
the retention balloon was insufflated. Under gravity retrograde
filling of the J pouch and small bowel was performed up to the level
of the ostomy site.
FLUOROSCOPY TIME:  Fluoroscopy Time:  2 minutes 24 seconds
Radiation Exposure Index (if provided by the fluoroscopic device):
93 mGy
Number of Acquired Spot Images: 0

[Series 1: one shot · 0.14mm/px · 14 of 24 slices shown]
[im 1/24]
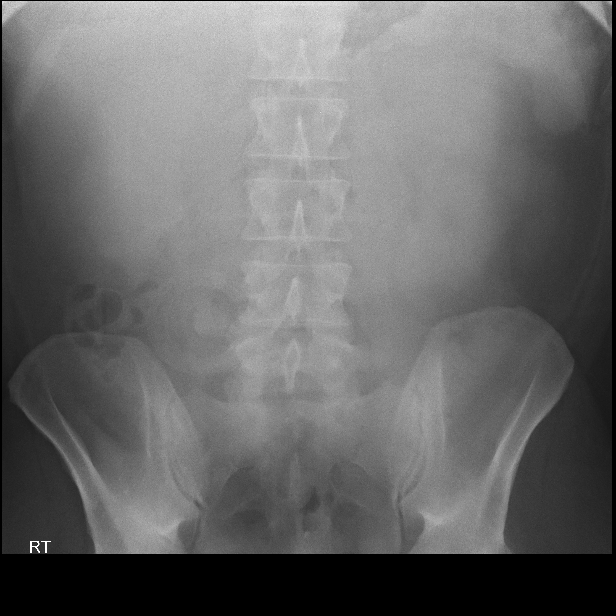
[im 3/24]
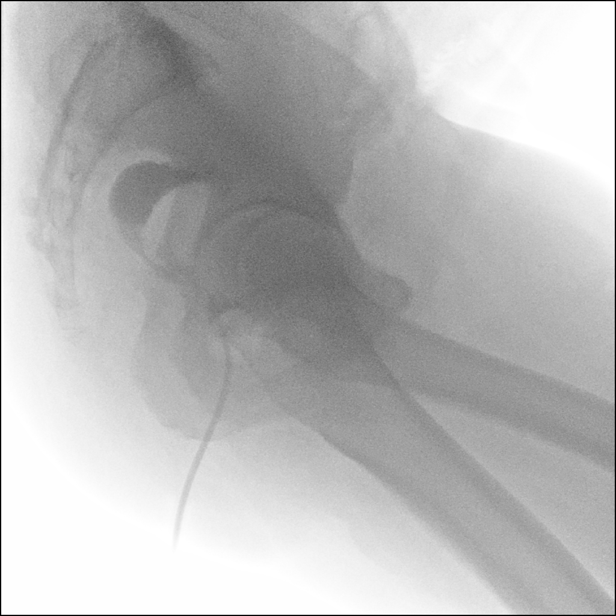
[im 5/24]
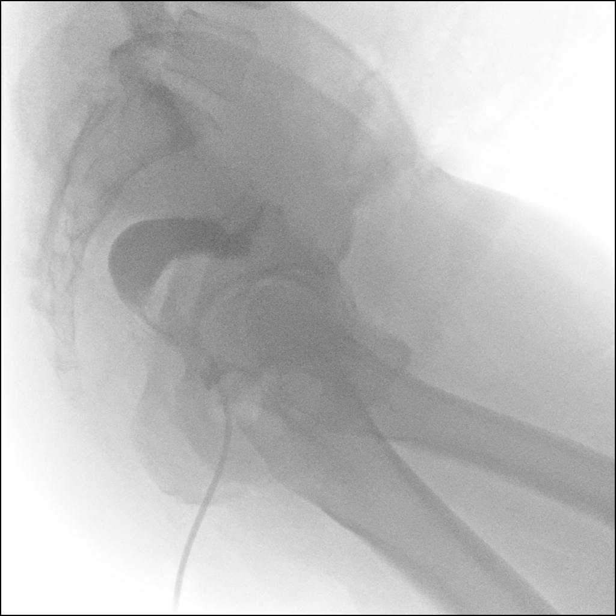
[im 7/24]
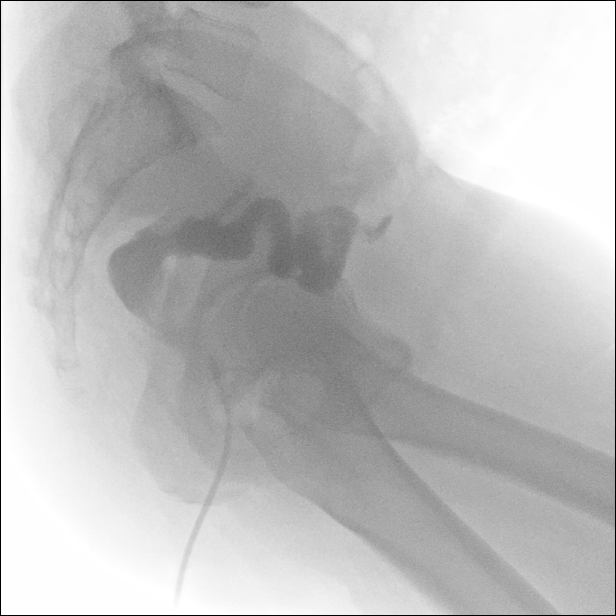
[im 8/24]
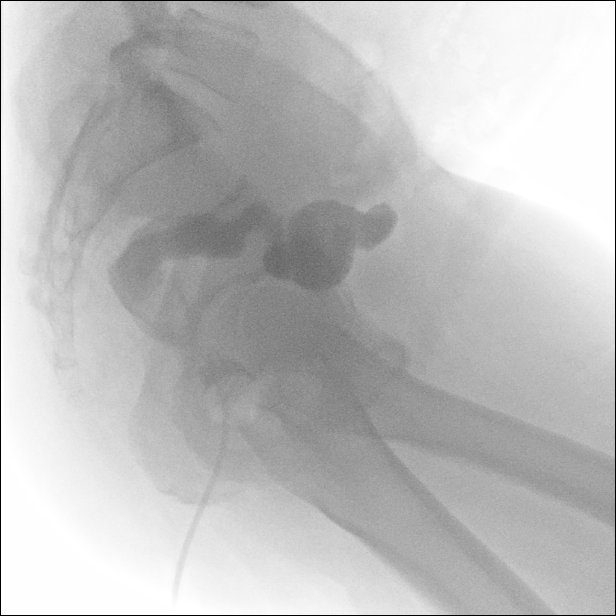
[im 10/24]
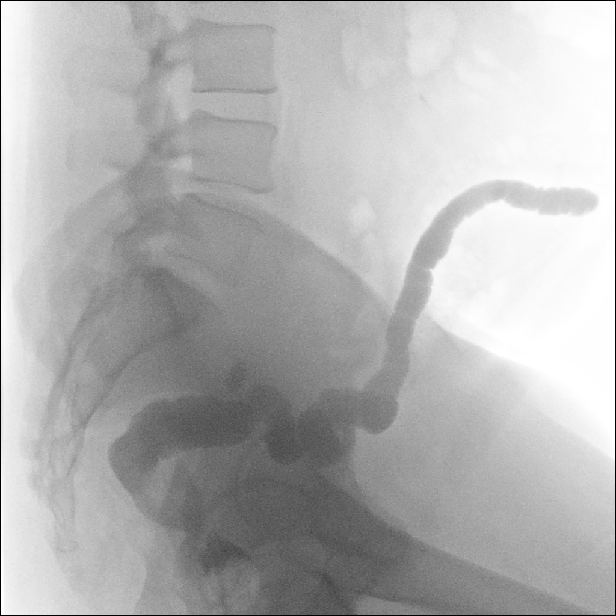
[im 12/24]
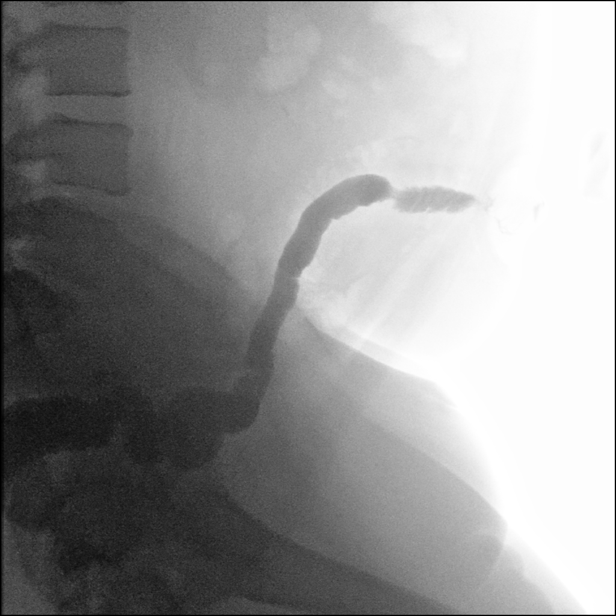
[im 13/24]
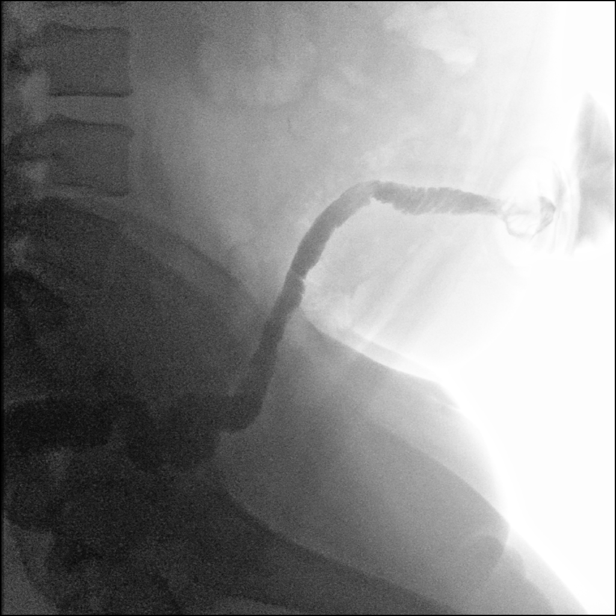
[im 15/24]
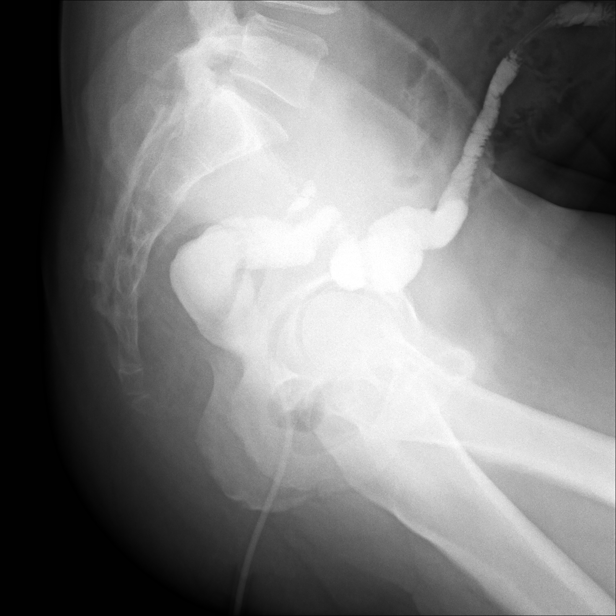
[im 17/24]
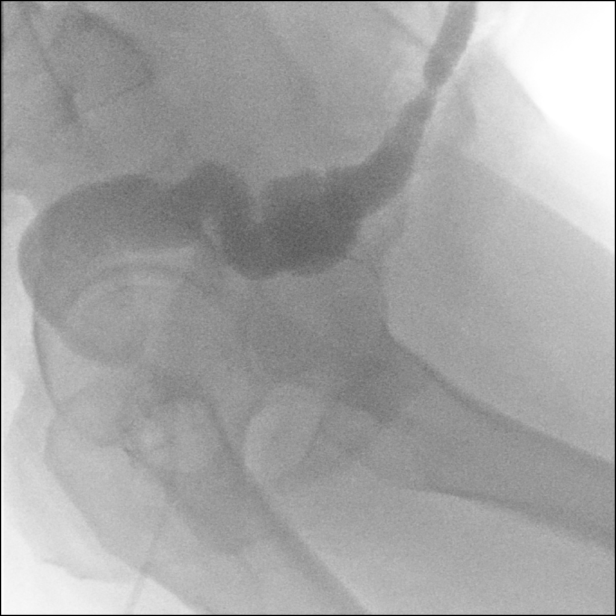
[im 19/24]
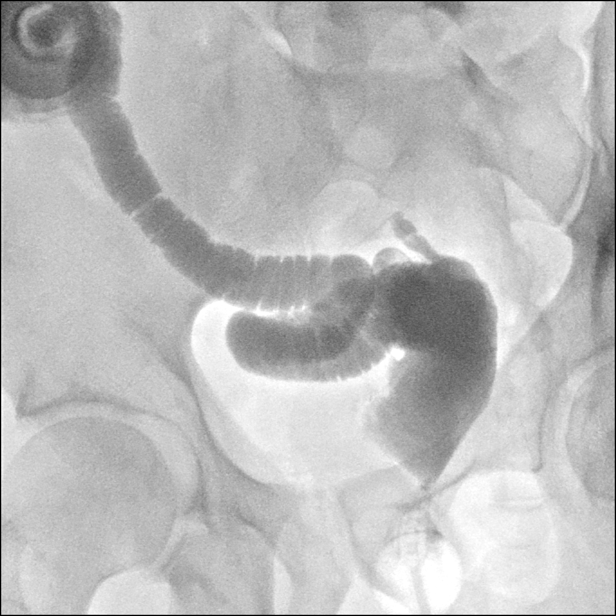
[im 20/24]
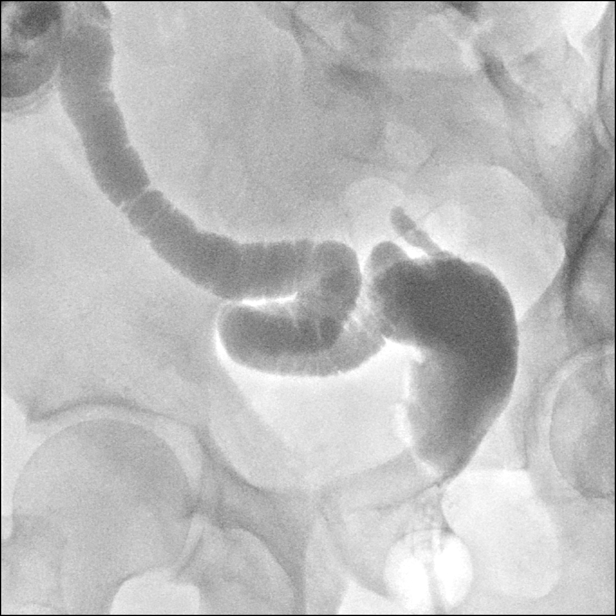
[im 22/24]
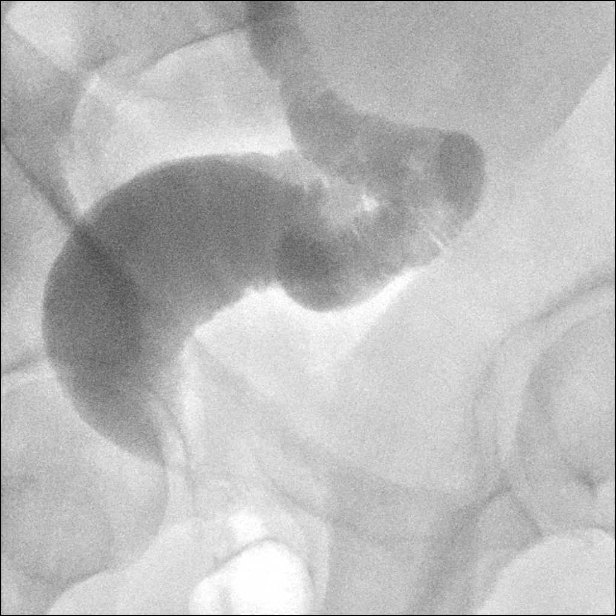
[im 24/24]
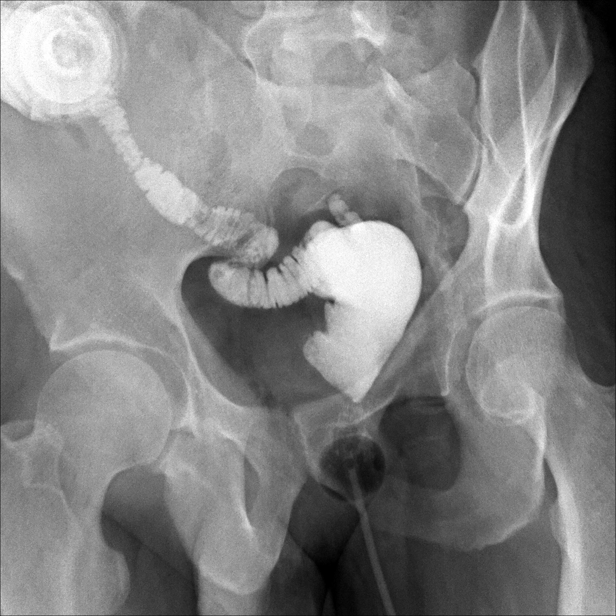

[14 of 24 positions shown; findings below may reference images not displayed]

FINDINGS: Contrast scratch set retrograde opacification of the J-pouch and
ileum was identified up to the level of the ostomy site with
contrast spillage into the ostomy bag.

No extravasation of contrast material identified. Arising from the
anastomotic site there is a blind-ending, short segment, tubular
collection of contrast material which under dynamic fluoroscopic
imaging appear to exhibit peristalsis. The remaining portions of the
J-pouch and ileum were unremarkable.
IMPRESSION: 1. Patent J-pouch and distal ileum up to the level of the loop
ileostomy. No free extravasation of contrast material identified.
2. A short segment, blind-ending tubular collection of contrast
material arises from the region of the J-pouch ileal anastomosis.
Assuming the patient underwent a side to end anastomosis this is
favored to represent a short segment of distal ileum. The
differential consideration would be a chronic sinus tract arising
from the anastomotic site due to anastomotic dehiscence. If further
imaging is clinically indicated a pelvic CT following rectal
contrast administration may provide more definitive characterization
of the anatomy at the level of the anastomosis.

## 2023-01-02 ENCOUNTER — Emergency Department (HOSPITAL_BASED_OUTPATIENT_CLINIC_OR_DEPARTMENT_OTHER)
Admission: EM | Admit: 2023-01-02 | Discharge: 2023-01-02 | Disposition: A | Discharge status: Home / Self Care | Payer: Commercial Managed Care - PPO | Attending: Emergency Medicine | Admitting: Emergency Medicine

## 2023-01-02 ENCOUNTER — Encounter (HOSPITAL_BASED_OUTPATIENT_CLINIC_OR_DEPARTMENT_OTHER): Payer: Self-pay | Admitting: Emergency Medicine

## 2023-01-02 ENCOUNTER — Emergency Department (HOSPITAL_BASED_OUTPATIENT_CLINIC_OR_DEPARTMENT_OTHER): Payer: Commercial Managed Care - PPO

## 2023-01-02 ENCOUNTER — Other Ambulatory Visit: Payer: Self-pay

## 2023-01-02 ENCOUNTER — Other Ambulatory Visit (HOSPITAL_BASED_OUTPATIENT_CLINIC_OR_DEPARTMENT_OTHER): Payer: Self-pay

## 2023-01-02 DIAGNOSIS — R0789 Other chest pain: Secondary | ICD-10-CM

## 2023-01-02 HISTORY — DX: Disorder of lipoprotein metabolism, unspecified: E78.9

## 2023-01-02 LAB — CBC WITH DIFFERENTIAL/PLATELET
Abs Immature Granulocytes: 0.02 10*3/uL (ref 0.00–0.07)
Basophils Absolute: 0 10*3/uL (ref 0.0–0.1)
Basophils Relative: 1 %
Eosinophils Absolute: 0.1 10*3/uL (ref 0.0–0.5)
Eosinophils Relative: 1 %
HCT: 44.2 % (ref 39.0–52.0)
Hemoglobin: 15.2 g/dL (ref 13.0–17.0)
Immature Granulocytes: 0 %
Lymphocytes Relative: 21 %
Lymphs Abs: 1.4 10*3/uL (ref 0.7–4.0)
MCH: 30.8 pg (ref 26.0–34.0)
MCHC: 34.4 g/dL (ref 30.0–36.0)
MCV: 89.7 fL (ref 80.0–100.0)
Monocytes Absolute: 0.4 10*3/uL (ref 0.1–1.0)
Monocytes Relative: 6 %
Neutro Abs: 4.6 10*3/uL (ref 1.7–7.7)
Neutrophils Relative %: 71 %
Platelets: 217 10*3/uL (ref 150–400)
RBC: 4.93 MIL/uL (ref 4.22–5.81)
RDW: 13.2 % (ref 11.5–15.5)
WBC: 6.6 10*3/uL (ref 4.0–10.5)
nRBC: 0 % (ref 0.0–0.2)

## 2023-01-02 LAB — LIPASE, BLOOD: Lipase: 47 U/L (ref 11–51)

## 2023-01-02 LAB — COMPREHENSIVE METABOLIC PANEL
ALT: 80 U/L — ABNORMAL HIGH (ref 0–44)
AST: 36 U/L (ref 15–41)
Albumin: 4.1 g/dL (ref 3.5–5.0)
Alkaline Phosphatase: 81 U/L (ref 38–126)
Anion gap: 12 (ref 5–15)
BUN: 12 mg/dL (ref 6–20)
CO2: 25 mmol/L (ref 22–32)
Calcium: 9.2 mg/dL (ref 8.9–10.3)
Chloride: 104 mmol/L (ref 98–111)
Creatinine, Ser: 0.9 mg/dL (ref 0.61–1.24)
GFR, Estimated: >60 mL/min (ref >60)
Glucose, Bld: 80 mg/dL (ref 70–99)
Potassium: 3.3 mmol/L — ABNORMAL LOW (ref 3.5–5.1)
Sodium: 141 mmol/L (ref 135–145)
Total Bilirubin: 0.6 mg/dL (ref 0.3–1.2)
Total Protein: 7 g/dL (ref 6.5–8.1)

## 2023-01-02 LAB — TROPONIN I (HIGH SENSITIVITY): Troponin I (High Sensitivity): 3 ng/L (ref <18)

## 2023-01-02 MED ORDER — PANTOPRAZOLE SODIUM 20 MG PO TBEC
20.0000 mg | DELAYED_RELEASE_TABLET | Freq: Every day | ORAL | 0 refills | Status: DC
  Filled 2023-01-02: qty 14, 14d supply, fill #0

## 2023-01-02 MED ORDER — SUCRALFATE 1 G PO TABS
1.0000 g | ORAL_TABLET | Freq: Three times a day (TID) | ORAL | 0 refills | Status: DC
  Filled 2023-01-02: qty 56, 14d supply, fill #0

## 2023-01-02 NOTE — ED Provider Notes (Signed)
[facility] EMERGENCY DEPT Provider Note   CSN: [id number] Arrival date & time: 01/02/23  9629     History  Chief Complaint  Patient presents with   Chest Pain    Brian Blackburn is a 47 y.o. male.  Patient here with chest pain since yesterday.  Denies any exertional pain or shortness of breath.  No recent surgery or travel.  No significant medical history except for ulcerative colitis.  History of anxiety.  May be history of high cholesterol.  Father may be had an MI in his late 34s.  Patient with pain sort of in the center of his chest.  Denies any nausea vomiting or diarrhea or abdominal pain.  Nothing makes it worse or better.  Pain very mild now.  Denies any infectious symptoms.  The history is provided by the patient.       Home Medications Prior to Admission medications   Medication Sig Start Date End Date Taking? Authorizing Provider  atorvastatin (LIPITOR) 20 MG tablet Take 20 mg by mouth at bedtime. 10/25/21  Yes [provider]  Cholecalciferol (VITAMIN D3) 125 MCG (5000 UT) TABS Take 10,000 Units by mouth daily.   Yes [provider]  FLUoxetine (PROZAC) 20 MG capsule Take 20 mg by mouth every evening. 10/14/21  Yes [provider]  pantoprazole (PROTONIX) 20 MG tablet Take 1 tablet (20 mg total) by mouth daily for 14 days. 01/02/23 01/16/23 Yes Brian Kirsch, DO  sucralfate (CARAFATE) 1 g tablet Take 1 tablet (1 g total) by mouth 4 (four) times daily -  with meals and at bedtime for 14 days. 01/02/23 01/16/23 Yes Brian Zaino, DO  diphenoxylate-atropine (LOMOTIL) 2.5-0.025 MG tablet Take 1 tablet by mouth 4 (four) times daily as needed for diarrhea or loose stools. 52/8/41   Brian Ruff, MD  loperamide (IMODIUM) 2 MG capsule Take 2 capsules (4 mg total) by mouth 4 (four) times daily as needed for diarrhea or loose stools. 32/4/40   Brian Ruff, MD  oxyCODONE (OXY IR/ROXICODONE) 5 MG immediate release tablet Take 1 tablet (5 mg  total) by mouth every 6 (six) hours as needed for severe pain (pain not relieved by tylenol, ibuprofen, rest or ice). 12/02/21   Brian Riley, MD  polycarbophil (FIBERCON) 625 MG tablet Take 3.5 tablets (2,200 mg total) by mouth 3 (three) times daily. 2.2 grams x 3 09/25/71   Brian Ruff, MD      Allergies    Azithromycin and Doxycycline    Review of Systems   Review of Systems  Physical Exam Updated Vital Signs BP (!) 136/92 (BP Location: Right Arm)   Pulse 78   Temp 99.1 F (37.3 C) (Oral)   Resp 16   Ht 6\' 1"  (1.854 m)   Wt 114.6 kg   SpO2 99%   BMI 33.34 kg/m  Physical Exam Vitals and nursing note reviewed.  Constitutional:      General: He is not in acute distress.    Appearance: He is well-developed. He is not ill-appearing.  HENT:     Head: Normocephalic and atraumatic.  Eyes:     Extraocular Movements: Extraocular movements intact.     Conjunctiva/sclera: Conjunctivae normal.     Pupils: Pupils are equal, round, and reactive to light.  Cardiovascular:     Rate and Rhythm: Normal rate and regular rhythm.     Pulses:          Radial pulses are 2+ on the right side and 2+ on  the left side.     Heart sounds: Normal heart sounds. No murmur heard. Pulmonary:     Effort: Pulmonary effort is normal. No respiratory distress.     Breath sounds: Normal breath sounds. No decreased breath sounds or wheezing.  Abdominal:     Palpations: Abdomen is soft.     Tenderness: There is no abdominal tenderness.  Musculoskeletal:        General: No swelling.     Cervical back: Normal range of motion and neck supple.     Right lower leg: No edema.     Left lower leg: No edema.  Skin:    General: Skin is warm and dry.     Capillary Refill: Capillary refill takes less than 2 seconds.  Neurological:     General: No focal deficit present.     Mental Status: He is alert.  Psychiatric:        Mood and Affect: Mood normal.     ED Results / Procedures / Treatments    Labs (all labs ordered are listed, but only abnormal results are displayed) Labs Reviewed  COMPREHENSIVE METABOLIC PANEL - Abnormal; Notable for the following components:      Result Value   Potassium 3.3 (*)    ALT 80 (*)    All other components within normal limits  CBC WITH DIFFERENTIAL/PLATELET  LIPASE, BLOOD  TROPONIN I (HIGH SENSITIVITY)    EKG EKG Interpretation  Date/Time:  Wednesday January 02 2023 07:01:41 EST Ventricular Rate:  82 PR Interval:  163 QRS Duration: 98 QT Interval:  374 QTC Calculation: 437 R Axis:   82 Text Interpretation: Sinus rhythm Confirmed by Brian Blackburn ([id number]) on 01/02/2023 7:19:07 AM  Radiology DG Chest Portable 1 View  Result Date: 01/02/2023 CLINICAL DATA:  Chest pain and chest tightness. EXAM: PORTABLE CHEST 1 VIEW COMPARISON:  PA and lateral chest and CTA chest 06/30/2018 FINDINGS: The heart size and mediastinal contours are within normal limits. Both lungs are clear. The visualized skeletal structures are unremarkable. IMPRESSION: No evidence of acute chest disease.  Stable chest. Electronically Signed   By: Brian Blackburn M.D.   On: 01/02/2023 07:45    Procedures Procedures    Medications Ordered in ED Medications - No data to display  ED Course/ Medical Decision Making/ A&P                           Medical Decision Making Amount and/or Complexity of Data Reviewed Labs: ordered. Radiology: ordered.  Risk Prescription drug management.   Cobe Viney is here with chest pain.  He has been having constant chest pain since yesterday but very mild.  Normal vitals.  No fever.  History of ulcerative colitis and high cholesterol.  Heart score is 1, patient is PERC negative.  Differential diagnosis likely GI related pain but will evaluate for ACS with troponin and EKG, will evaluate for anemia or electrolyte abnormality with CBC, BMP, lipase and hepatic function panel.  Not having any major upper abdominal pain seems less likely to be  pancreatitis or cholecystitis.  EKG per my review interpretation shows sinus rhythm.  No ischemic changes.  Overall he is very well-appearing.  Will obtain a chest x-ray as well.  Per my review and interpretation of labs no significant anemia or electrolyte abnormality or kidney injury.  Troponin normal.  Chest x-ray without any evidence of pneumonia or pneumothorax.  Overall well-appearing.  Not having any major chest pain.  Has no major cardiac risk factors.  Pains been ongoing for about 1 day and troponins within normal limits.  I do not think he needs a delta troponin at this time.  Will have him follow-up with cardiology and start him on reflux medications.  Recommend follow-up with primary care doctor as well.  This chart was dictated using voice recognition software.  Despite best efforts to proofread,  errors can occur which can change the documentation meaning.         Final Clinical Impression(s) / ED Diagnoses Final diagnoses:  Atypical chest pain    Rx / DC Orders ED Discharge Orders          Ordered    Ambulatory referral to Cardiology       Comments: If you have not heard from the Cardiology office within the next 72 hours please call [phone].   01/02/23 0908    pantoprazole (PROTONIX) 20 MG tablet  Daily        01/02/23 0908    sucralfate (CARAFATE) 1 g tablet  3 times daily with meals & bedtime        01/02/23 0908              Lennice Sites, DO 01/02/23 [phone]

## 2023-01-02 NOTE — ED Triage Notes (Signed)
States had a episode of chest tightness to mid chest lasting about 1 hr while sitting at his desk, pain at worst was a 2/10. Woke up at 0530 this am 1/10, with "slight tightness" Denies n/v or SOB

## 2023-01-02 NOTE — ED Notes (Signed)
ED Provider at bedside. 

## 2023-01-20 ENCOUNTER — Encounter: Payer: Self-pay | Admitting: Cardiovascular Disease

## 2023-01-20 NOTE — Progress Notes (Unsigned)
Cardiology Office Note:    Date:  01/21/2023   ID:  Brian Blackburn, DOB 12-17-1976, MRN [id number]  PCP:  [facility] Cardiologist:  Raiana Pharris     Referring MD: Lennice Sites, DO   Chief Complaint  Patient presents with   Chest Pain     History of Present Illness:    Brian Blackburn is a 47 y.o. male with a hx of  anxiety, borderline HLD, ulcerative colitis  We are asked to see him for further evaluation of chest pain    Went to er several weeks ago with CP  Was thought to be GERD  Received Omeprozole, pain was relieved  Was told to follow up   Troponin was negative  K was 3.3   Has chronic diarrhea, no blood in stool   Former smoker  Walks occasionally , no cardio exercise  Works at [employer], clinical)  No recurrence ,  Eating is about the same   Fam hx  Father has had 2 MI  - 62st in mid 12s ( smoker  Fathers grandparents - CVAs   Labs from the [facility] system  August 13, 2022: Total cholesterol is 158 Triglyceride levels 110 HDL is 85 LDL is 54  Hemoglobin A1c is 5.8 .   For further evaluation of his chest pain we will get a coronary CT angiogram.  He does have a positive family history of coronary artery disease and early myocardial infarction.  Will draw a lipoprotein a today.  I will see him back in 3 to 4 months for follow-up visit.    Past Medical History:  Diagnosis Date   Anxiety    Borderline high cholesterol    PONV (postoperative nausea and vomiting)    AS A SMALL CHILD    Ulcerative colitis [facility])     Past Surgical History:  Procedure Laterality Date   BIOPSY  01/26/2021   Procedure: BIOPSY;  Surgeon: Leighton Ruff, MD;  Location: [facility];  Service: Endoscopy;;   ENDOVENOUS ABLATION SAPHENOUS VEIN W/ LASER     FLEXIBLE SIGMOIDOSCOPY N/A 01/26/2021   Procedure: FLEXIBLE SIGMOIDOSCOPY;  Surgeon: Leighton Ruff, MD;  Location: [facility];  Service: Endoscopy;   Laterality: N/A;   ILEO LOOP COLOSTOMY CLOSURE N/A 11/30/2021   Procedure: ILEOSTOMY REVERSAL, RECTAL DILATION;  Surgeon: Leighton Ruff, MD;  Location: [facility] ORS;  Service: General;  Laterality: N/A;   LAPAROSCOPIC LYSIS OF ADHESIONS N/A 06/23/2021   Procedure: LAPAROSCOPIC LYSIS OF ADHESIONS;  Surgeon: Leighton Ruff, MD;  Location: [facility] ORS;  Service: General;  Laterality: N/A;   POUCHOSCOPY N/A 10/27/2021   Procedure: POUCHOSCOPY;  Surgeon: Leighton Ruff, MD;  Location: [facility] END[facility];  Service: Endoscopy;  Laterality: N/A;   XI ROBOTIC ASSISTED LOWER ANTERIOR RESECTION N/A 06/23/2021   Procedure: XI ROBOTIC ASSISTED PROCTECTOMY WITH J POUCH CREATION AND LOOP ILEOSTOMY;  Surgeon: Leighton Ruff, MD;  Location: [facility] ORS;  Service: General;  Laterality: N/A;    Current Medications: Current Meds  Medication Sig   atorvastatin (LIPITOR) 20 MG tablet Take 20 mg by mouth at bedtime.   Cholecalciferol (VITAMIN D3) 125 MCG (5000 UT) TABS Take 10,000 Units by mouth daily.   FLUoxetine (PROZAC) 20 MG capsule Take 20 mg by mouth every evening.   metoprolol tartrate (LOPRESSOR) 100 MG tablet Take 1 tablet by mouth two hours prior to scan     Allergies:   Azithromycin and Doxycycline   Social History   Socioeconomic  History   Marital status: Married    Spouse name: Not on file   Number of children: Not on file   Years of education: Not on file   Highest education level: Not on file  Occupational History   Not on file  Tobacco Use   Smoking status: Former   Smokeless tobacco: Current    Types: Chew  Vaping Use   Vaping Use: Never used  Substance and Sexual Activity   Alcohol use: Yes    Comment: FEW GLASSES PER WEEK    Drug use: Never   Sexual activity: Not on file  Other Topics Concern   Not on file  Social History Narrative   Not on file   Social Determinants of Health   Financial Resource Strain: Not on file  Food Insecurity: Not on file  Transportation Needs: Not on file  Physical  Activity: Not on file  Stress: Not on file  Social Connections: Not on file     Family History: The patient's family history includes Heart failure in his father.  ROS:   Please see the history of present illness.     All other systems reviewed and are negative.  EKGs/Labs/Other Studies Reviewed:    The following studies were reviewed today:   EKG: January 02, 2023: Normal sinus rhythm at 82.  Normal EKG.  Recent Labs: 01/02/2023: ALT 80; BUN 12; Creatinine, Ser 0.90; Hemoglobin 15.2; Platelets 217; Potassium 3.3; Sodium 141  Recent Lipid Panel No results found for: "CHOL", "TRIG", "HDL", "CHOLHDL", "VLDL", "LDLCALC", "LDLDIRECT"   Risk Assessment/Calculations:                Physical Exam:    VS:  BP 114/86   Pulse 96   Ht 6\' 1"  (1.854 m)   Wt 249 lb 3.2 oz (113 kg)   SpO2 98%   BMI 32.88 kg/m     Wt Readings from Last 3 Encounters:  01/21/23 249 lb 3.2 oz (113 kg)  01/02/23 252 lb 11.2 oz (114.6 kg)  11/30/21 235 lb (106.6 kg)     GEN:  Well nourished, well developed in no acute distress HEENT: Normal NECK: No JVD; No carotid bruits LYMPHATICS: No lymphadenopathy CARDIAC: RRR, no murmurs, rubs, gallops RESPIRATORY:  Clear to auscultation without rales, wheezing or rhonchi  ABDOMEN: Soft, non-tender, non-distended MUSCULOSKELETAL:  No edema; No deformity  SKIN: Warm and dry NEUROLOGIC:  Alert and oriented x 3 PSYCHIATRIC:  Normal affect   ASSESSMENT:    1. Chest pain of uncertain etiology   2. Mixed hyperlipidemia   3. Family history of coronary artery disease in father   25. Pre-procedural cardiovascular examination    PLAN:    In order of problems listed above:  Chest pain :     For further evaluation of his chest pain we will get a coronary CT angiogram.  He does have a positive family history of coronary artery disease and early myocardial infarction.  Will draw a lipoprotein a today.  I will see him back in 3 to 4 months for follow-up  visit.           Medication Adjustments/Labs and Tests Ordered: Current medicines are reviewed at length with the patient today.  Concerns regarding medicines are outlined above.  Orders Placed This Encounter  Procedures   CT CORONARY MORPH W/CTA COR W/SCORE W/CA W/CM &/OR WO/CM   Basic metabolic panel   Lipoprotein A (LPA)   Meds ordered this encounter  Medications  metoprolol tartrate (LOPRESSOR) 100 MG tablet    Sig: Take 1 tablet by mouth two hours prior to scan    Dispense:  1 tablet    Refill:  0    Patient Instructions  Medication Instructions:  Your physician recommends that you continue on your current medications as directed. Please refer to the Current Medication list given to you today.  *If you need a refill on your cardiac medications before your next appointment, please call your pharmacy*   Lab Work: BMET, LP(a) today If you have labs (blood work) drawn today and your tests are completely normal, you will receive your results only by: [place] (if you have MyChart) OR A paper copy in the mail If you have any lab test that is abnormal or we need to change your treatment, we will call you to review the results.  Testing/Procedures: Coronary CT Angiogram Your physician has requested that you have cardiac CT. Cardiac computed tomography (CT) is a painless test that uses an x-ray machine to take clear, detailed pictures of your heart. For further information please visit [email or web address]. Please follow instruction sheet as given.  Follow-Up: At [facility], you and your health needs are our priority.  As part of our continuing mission to provide you with exceptional heart care, we have created designated Provider Care Teams.  These Care Teams include your primary Cardiologist (physician) and Advanced Practice Providers (APPs -  Physician Assistants and Nurse Practitioners) who all work together to provide you with the care you need, when you  need it.  Your next appointment:   3 month(s)  Provider:   Mertie Moores, MD    Other Instructions   Your cardiac CT will be scheduled at:   [facility] [address] [place], [place] [address] [phone]  please arrive at the [facility] and Children's Entrance (Entrance C2) of [facility] 30 minutes prior to test start time. You can use the FREE valet parking offered at entrance C (encouraged to control the heart rate for the test)  Proceed to the [facility] Radiology Department (first floor) to check-in and test prep.  All radiology patients and guests should use entrance C2 at [facility], accessed from [facility], even though the hospital's physical address listed is [address].     Please follow these instructions carefully (unless otherwise directed):  Hold all erectile dysfunction medications at least 3 days (72 hrs) prior to test. (Ie viagra, cialis, sildenafil, tadalafil, etc) We will administer nitroglycerin during this exam.   On the Night Before the Test: Be sure to Drink plenty of water. Do not consume any caffeinated/decaffeinated beverages or chocolate 12 hours prior to your test. Do not take any antihistamines 12 hours prior to your test.  On the Day of the Test: Drink plenty of water until 1 hour prior to the test. Do not eat any food 1 hour prior to test. You may take your regular medications prior to the test.  Take metoprolol (Lopressor) two hours prior to test.      After the Test: Drink plenty of water. After receiving IV contrast, you may experience a mild flushed feeling. This is normal. On occasion, you may experience a mild rash up to 24 hours after the test. This is not dangerous. If this occurs, you can take Benadryl 25 mg and increase your fluid intake. If you experience trouble breathing, this can be serious. If it is severe call 911  IMMEDIATELY. If it is mild, please call our  office. If you take any of these medications: Glipizide/Metformin, Avandament, Glucavance, please do not take 48 hours after completing test unless otherwise instructed.  We will call to schedule your test 2-4 weeks out understanding that some insurance companies will need an authorization prior to the service being performed.   For non-scheduling related questions, please contact the cardiac imaging nurse navigator should you have any questions/concerns: Rockwell [place], Cardiac Imaging Nurse Navigator Larey Brick, Cardiac Imaging Nurse Navigator [facility] Direct Office Dial: [phone]   For scheduling needs, including cancellations and rescheduling, please call [place], [phone].    Signed, Kristeen Miss, MD  01/21/2023 6:45 PM    [facility]

## 2023-01-21 ENCOUNTER — Ambulatory Visit: Payer: Commercial Managed Care - PPO | Attending: Cardiovascular Disease | Admitting: Cardiovascular Disease

## 2023-01-21 ENCOUNTER — Encounter: Payer: Self-pay | Admitting: Cardiovascular Disease

## 2023-01-21 VITALS — BP 114/86 | HR 96 | Ht 73.0 in | Wt 249.2 lb

## 2023-01-21 DIAGNOSIS — Z01810 Encounter for preprocedural cardiovascular examination: Secondary | ICD-10-CM | POA: Diagnosis not present

## 2023-01-21 DIAGNOSIS — E782 Mixed hyperlipidemia: Secondary | ICD-10-CM | POA: Diagnosis not present

## 2023-01-21 DIAGNOSIS — Z8249 Family history of ischemic heart disease and other diseases of the circulatory system: Secondary | ICD-10-CM | POA: Diagnosis not present

## 2023-01-21 DIAGNOSIS — R079 Chest pain, unspecified: Secondary | ICD-10-CM | POA: Diagnosis not present

## 2023-01-21 MED ORDER — METOPROLOL TARTRATE 100 MG PO TABS: ORAL_TABLET | ORAL | 0 refills | Status: AC

## 2023-01-21 NOTE — Patient Instructions (Addendum)
Medication Instructions:  Your physician recommends that you continue on your current medications as directed. Please refer to the Current Medication list given to you today.  *If you need a refill on your cardiac medications before your next appointment, please call your pharmacy*   Lab Work: BMET, LP(a) today If you have labs (blood work) drawn today and your tests are completely normal, you will receive your results only by: [place] (if you have MyChart) OR A paper copy in the mail If you have any lab test that is abnormal or we need to change your treatment, we will call you to review the results.  Testing/Procedures: Coronary CT Angiogram Your physician has requested that you have cardiac CT. Cardiac computed tomography (CT) is a painless test that uses an x-ray machine to take clear, detailed pictures of your heart. For further information please visit [email or web address]. Please follow instruction sheet as given.  Follow-Up: At [facility], you and your health needs are our priority.  As part of our continuing mission to provide you with exceptional heart care, we have created designated Provider Care Teams.  These Care Teams include your primary Cardiologist (physician) and Advanced Practice Providers (APPs -  Physician Assistants and Nurse Practitioners) who all work together to provide you with the care you need, when you need it.  Your next appointment:   3 month(s)  Provider:   Mertie Moores, MD    Other Instructions   Your cardiac CT will be scheduled at:   [facility] [address] [place], [place] [address] [phone]  please arrive at the [facility] (Entrance C2) of [facility] 30 minutes prior to test start time. You can use the FREE valet parking offered at entrance C (encouraged to control the heart rate for the test)  Proceed to the [facility] Radiology Department (first floor) to check-in and  test prep.  All radiology patients and guests should use entrance C2 at [facility], accessed from [facility], even though the hospital's physical address listed is [address]     Please follow these instructions carefully (unless otherwise directed):  Hold all erectile dysfunction medications at least 3 days (72 hrs) prior to test. (Ie viagra, cialis, sildenafil, tadalafil, etc) We will administer nitroglycerin during this exam.   On the Night Before the Test: Be sure to Drink plenty of water. Do not consume any caffeinated/decaffeinated beverages or chocolate 12 hours prior to your test. Do not take any antihistamines 12 hours prior to your test.  On the Day of the Test: Drink plenty of water until 1 hour prior to the test. Do not eat any food 1 hour prior to test. You may take your regular medications prior to the test.  Take metoprolol (Lopressor) two hours prior to test.      After the Test: Drink plenty of water. After receiving IV contrast, you may experience a mild flushed feeling. This is normal. On occasion, you may experience a mild rash up to 24 hours after the test. This is not dangerous. If this occurs, you can take Benadryl 25 mg and increase your fluid intake. If you experience trouble breathing, this can be serious. If it is severe call 911 IMMEDIATELY. If it is mild, please call our office. If you take any of these medications: Glipizide/Metformin, Avandament, Glucavance, please do not take 48 hours after completing test unless otherwise instructed.  We will call to schedule your test  2-4 weeks out understanding that some insurance companies will need an authorization prior to the service being performed.   For non-scheduling related questions, please contact the cardiac imaging nurse navigator should you have any questions/concerns: Marchia Bond, Cardiac Imaging Nurse Navigator Gordy Clement, Cardiac Imaging Nurse Navigator [place]  [facility] Direct Office Dial: [phone]   For scheduling needs, including cancellations and rescheduling, please call [place], [phone].

## 2023-01-22 LAB — BASIC METABOLIC PANEL
BUN/Creatinine Ratio: 24 — ABNORMAL HIGH (ref 9–20)
BUN: 22 mg/dL (ref 6–24)
CO2: 20 mmol/L (ref 20–29)
Calcium: 9.8 mg/dL (ref 8.7–10.2)
Chloride: 106 mmol/L (ref 96–106)
Creatinine, Ser: 0.92 mg/dL (ref 0.76–1.27)
Glucose: 109 mg/dL — ABNORMAL HIGH (ref 70–99)
Potassium: 4.2 mmol/L (ref 3.5–5.2)
Sodium: 142 mmol/L (ref 134–144)
eGFR: 105 mL/min/{1.73_m2} (ref >59)

## 2023-01-22 LAB — LIPOPROTEIN A (LPA): Lipoprotein (a): 15 nmol/L (ref <75.0)

## 2023-01-25 ENCOUNTER — Telehealth (HOSPITAL_COMMUNITY): Payer: Self-pay | Admitting: *Deleted

## 2023-01-25 NOTE — Telephone Encounter (Signed)
Calling patient to reschedule his CCTA appointment due to lack of insurance authorization. Left message with name and call back number.  Gordy Clement RN Navigator Cardiac Imaging [facility] [phone] Office [phone] Cell

## 2023-01-28 ENCOUNTER — Ambulatory Visit (HOSPITAL_COMMUNITY): Admission: RE | Admit: 2023-01-28 | Payer: Commercial Managed Care - PPO | Source: Ambulatory Visit

## 2023-02-05 ENCOUNTER — Telehealth (HOSPITAL_COMMUNITY): Payer: Self-pay | Admitting: Emergency Medicine

## 2023-02-05 NOTE — Telephone Encounter (Signed)
Reaching out to patient to offer assistance regarding upcoming cardiac imaging study; pt verbalizes understanding of appt date/time, parking situation and where to check in, pre-test NPO status and medications ordered, and verified current allergies; name and call back number provided for further questions should they arise Marchia Bond RN Navigator Cardiac Imaging [facility] [phone] office [phone] cell  Arrival 300 [address] entrance 130m metoprolol  Denies iv issues Aware contrast/nitro

## 2023-02-06 ENCOUNTER — Ambulatory Visit (HOSPITAL_COMMUNITY)
Admission: RE | Admit: 2023-02-06 | Discharge: 2023-02-06 | Disposition: A | Discharge status: Home / Self Care | Payer: Commercial Managed Care - PPO | Source: Ambulatory Visit | Attending: Cardiovascular Disease | Admitting: Cardiovascular Disease

## 2023-02-06 DIAGNOSIS — Z8249 Family history of ischemic heart disease and other diseases of the circulatory system: Secondary | ICD-10-CM | POA: Diagnosis not present

## 2023-02-06 DIAGNOSIS — R079 Chest pain, unspecified: Secondary | ICD-10-CM

## 2023-02-06 DIAGNOSIS — E782 Mixed hyperlipidemia: Secondary | ICD-10-CM | POA: Diagnosis not present

## 2023-02-06 MED ORDER — NITROGLYCERIN 0.4 MG SL SUBL: 0.8000 mg | SUBLINGUAL_TABLET | Freq: Once | SUBLINGUAL | Status: AC

## 2023-02-06 MED ORDER — NITROGLYCERIN 0.4 MG SL SUBL
SUBLINGUAL_TABLET | SUBLINGUAL | Status: AC
  Administered 2023-02-06: 0.8 mg via SUBLINGUAL
  Filled 2023-02-06: qty 2

## 2023-02-06 MED ORDER — IOHEXOL 350 MG/ML SOLN
100.0000 mL | Freq: Once | INTRAVENOUS | Status: AC | PRN
  Administered 2023-02-06: 100 mL via INTRAVENOUS

## 2023-04-30 ENCOUNTER — Encounter: Payer: Self-pay | Admitting: Cardiovascular Disease

## 2023-04-30 NOTE — Progress Notes (Unsigned)
This encounter was created in error - please disregard.

## 2023-05-01 ENCOUNTER — Ambulatory Visit: Payer: Commercial Managed Care - PPO | Attending: Cardiovascular Disease | Admitting: Cardiovascular Disease

## 2023-05-02 ENCOUNTER — Encounter: Payer: Self-pay | Admitting: Cardiovascular Disease
